# Patient Record
Sex: Male | Born: 1950
Health system: Southern US, Community
[De-identification: ages and names within clinical notes are randomized; demographics above are authoritative.]

## PROBLEM LIST (undated history)

## (undated) DIAGNOSIS — T7840XA Allergy, unspecified, initial encounter: Secondary | ICD-10-CM

## (undated) DIAGNOSIS — N200 Calculus of kidney: Secondary | ICD-10-CM

## (undated) DIAGNOSIS — I639 Cerebral infarction, unspecified: Secondary | ICD-10-CM

## (undated) DIAGNOSIS — Q2730 Arteriovenous malformation, site unspecified: Secondary | ICD-10-CM

## (undated) HISTORY — DX: Cerebral infarction, unspecified: I63.9

## (undated) HISTORY — DX: Allergy, unspecified, initial encounter: T78.40XA

## (undated) HISTORY — PX: SPINE SURGERY: SHX786

## (undated) HISTORY — PX: LAMINECTOMY: SHX219

---

## 1968-07-09 DIAGNOSIS — M545 Low back pain, unspecified: Secondary | ICD-10-CM | POA: Insufficient documentation

## 1968-07-09 HISTORY — PX: INGUINAL HERNIA REPAIR: SUR1180

## 1996-07-09 HISTORY — PX: BACK SURGERY: SHX140

## 2013-05-09 DEATH — deceased

## 2015-05-02 DIAGNOSIS — H2513 Age-related nuclear cataract, bilateral: Secondary | ICD-10-CM | POA: Diagnosis not present

## 2015-05-05 ENCOUNTER — Encounter (HOSPITAL_COMMUNITY): Payer: Self-pay | Admitting: *Deleted

## 2015-05-05 ENCOUNTER — Emergency Department (HOSPITAL_COMMUNITY): Payer: Medicare Other

## 2015-05-05 ENCOUNTER — Emergency Department (HOSPITAL_COMMUNITY)
Admission: EM | Admit: 2015-05-05 | Discharge: 2015-05-05 | Disposition: A | Discharge status: Home / Self Care | Payer: Medicare Other | Attending: Emergency Medicine | Admitting: Emergency Medicine

## 2015-05-05 DIAGNOSIS — Q2730 Arteriovenous malformation, site unspecified: Secondary | ICD-10-CM | POA: Diagnosis not present

## 2015-05-05 DIAGNOSIS — Z87442 Personal history of urinary calculi: Secondary | ICD-10-CM | POA: Insufficient documentation

## 2015-05-05 DIAGNOSIS — R2689 Other abnormalities of gait and mobility: Secondary | ICD-10-CM | POA: Diagnosis not present

## 2015-05-05 DIAGNOSIS — Z720 Tobacco use: Secondary | ICD-10-CM | POA: Diagnosis not present

## 2015-05-05 DIAGNOSIS — R42 Dizziness and giddiness: Secondary | ICD-10-CM | POA: Insufficient documentation

## 2015-05-05 DIAGNOSIS — R278 Other lack of coordination: Secondary | ICD-10-CM | POA: Diagnosis not present

## 2015-05-05 DIAGNOSIS — R030 Elevated blood-pressure reading, without diagnosis of hypertension: Secondary | ICD-10-CM | POA: Diagnosis not present

## 2015-05-05 DIAGNOSIS — Q283 Other malformations of cerebral vessels: Secondary | ICD-10-CM | POA: Insufficient documentation

## 2015-05-05 DIAGNOSIS — R413 Other amnesia: Secondary | ICD-10-CM | POA: Diagnosis not present

## 2015-05-05 DIAGNOSIS — R4182 Altered mental status, unspecified: Secondary | ICD-10-CM | POA: Diagnosis not present

## 2015-05-05 DIAGNOSIS — Q282 Arteriovenous malformation of cerebral vessels: Secondary | ICD-10-CM | POA: Diagnosis not present

## 2015-05-05 HISTORY — DX: Calculus of kidney: N20.0

## 2015-05-05 HISTORY — DX: Arteriovenous malformation, site unspecified: Q27.30

## 2015-05-05 LAB — CBC
HEMATOCRIT: 47.1 % (ref 39.0–52.0)
Hemoglobin: 16.2 g/dL (ref 13.0–17.0)
MCH: 29.3 pg (ref 26.0–34.0)
MCHC: 34.4 g/dL (ref 30.0–36.0)
MCV: 85.3 fL (ref 78.0–100.0)
Platelets: 192 10*3/uL (ref 150–400)
RBC: 5.52 MIL/uL (ref 4.22–5.81)
RDW: 13.2 % (ref 11.5–15.5)
WBC: 6.7 10*3/uL (ref 4.0–10.5)

## 2015-05-05 LAB — HEPATIC FUNCTION PANEL
ALK PHOS: 48 U/L (ref 38–126)
ALT: 23 U/L (ref 17–63)
AST: 38 U/L (ref 15–41)
Albumin: 3.9 g/dL (ref 3.5–5.0)
BILIRUBIN INDIRECT: 0.7 mg/dL (ref 0.3–0.9)
Bilirubin, Direct: 0.4 mg/dL (ref 0.1–0.5)
TOTAL PROTEIN: 5.9 g/dL — AB (ref 6.5–8.1)
Total Bilirubin: 1.1 mg/dL (ref 0.3–1.2)

## 2015-05-05 LAB — TROPONIN I: Troponin I: <0.03 ng/mL (ref <0.031)

## 2015-05-05 LAB — TSH: TSH: 1.734 u[IU]/mL (ref 0.350–4.500)

## 2015-05-05 LAB — BASIC METABOLIC PANEL
Anion gap: 7 (ref 5–15)
BUN: 12 mg/dL (ref 6–20)
CHLORIDE: 107 mmol/L (ref 101–111)
CO2: 27 mmol/L (ref 22–32)
Calcium: 9.3 mg/dL (ref 8.9–10.3)
Creatinine, Ser: 1.1 mg/dL (ref 0.61–1.24)
GFR calc Af Amer: >60 mL/min (ref >60)
GFR calc non Af Amer: >60 mL/min (ref >60)
GLUCOSE: 98 mg/dL (ref 65–99)
POTASSIUM: 4.5 mmol/L (ref 3.5–5.1)
Sodium: 141 mmol/L (ref 135–145)

## 2015-05-05 NOTE — ED Notes (Signed)
Patient transported to MRI 

## 2015-05-05 NOTE — ED Notes (Signed)
MD at bedside. 

## 2015-05-05 NOTE — ED Notes (Signed)
Patient reports 2 week onset of dizziness, not feeling right, lack of balance and coordination, and word searching. Patient reports his of AVM bleed and states these are the same symptoms.

## 2015-05-05 NOTE — Discharge Instructions (Signed)
Cavernous Malformation A cavernous malformation is an abnormal formation of blood vessels that are more likely than regular blood vessels to bleed (hemorrhage) and cause other problems. A cavernous malformation most often develops in or beneath the skin, but it can also develop in internal organs. It can be very dangerous if it develops in the brain or the spinal cord. CAUSES This condition may be caused by a gene that is passed down from parent to child (inherited). In some cases, the cause is not known. RISK FACTORS This condition is more likely to develop in people of Hispanic descent. SYMPTOMS Symptoms of this condition include:  Seizures.  Headaches.  Nausea or vomiting.  Changes in hearing or vision.  Paralysis. Symptoms usually develop only if the brain or spinal cord are affected. DIAGNOSIS This condition is diagnosed with an imaging test, such as MRI or a CT scan. TREATMENT Treatment for this condition depends on whether symptoms are present. If there are no symptoms, treatment may not be needed. If there are symptoms, treatment options may include:  Supportive care. This means that treatment focuses on relieving your symptoms.  Surgery. This condition should be monitored on a regular basis by a health care provider. HOME CARE INSTRUCTIONS  Take over-the-counter and prescription medicines only as told by your health care provider.  Keep all follow-up visits as told by your health care provider. This is important. SEEK MEDICAL CARE IF:  Your symptoms do not improve with treatment.  You develop new symptoms, including new changes in hearing or vision.  You develop a headache that does not go away.  Your symptoms return. SEEK IMMEDIATE MEDICAL CARE IF:  You are having stroke-like symptoms, such as:  Weakness or numbness of the face, arm, or leg.  Sudden trouble seeing with one eye or both eyes.  Trouble talking (aphasia) or slurred speech.  Sudden confusion  or trouble understanding.  You have a sudden, severe headache.  You have seizure or your seizures are not controlled with your seizure medicine.  You have dizziness or you lose your balance. These symptoms may represent a serious problem that is an emergency. Do not wait to see if the symptoms will go away. Get medical help right away. Call your local emergency services (911 in the U.[place] Do not drive yourself to the hospital.   This information is not intended to replace advice given to you by your health care provider. Make sure you discuss any questions you have with your health care provider.   Document Released: 03/17/2002 Document Revised: 03/16/2015 Document Reviewed: 08/19/2014 Elsevier Interactive Patient Education 2016 [employer].  Emergency Department Resource Guide 1) Find a Doctor and Pay Out of Pocket Although you won't have to find out who is covered by your insurance plan, it is a good idea to ask around and get recommendations. You will then need to call the office and see if the doctor you have chosen will accept you as a new patient and what types of options they offer for patients who are self-pay. Some doctors offer discounts or will set up payment plans for their patients who do not have insurance, but you will need to ask so you aren't surprised when you get to your appointment.  2) Contact Your Local Health Department Not all health departments have doctors that can see patients for sick visits, but many do, so it is worth a call to see if yours does. If you don't know where your local health department is, you  can check in your phone book. The CDC also has a tool to help you locate your state's health department, and many state websites also have listings of all of their local health departments.  3) Find a [facility] If your illness is not likely to be very severe or complicated, you may want to try a walk in clinic. These are popping up all over the country in  pharmacies, drugstores, and shopping centers. They're usually staffed by nurse practitioners or physician assistants that have been trained to treat common illnesses and complaints. They're usually fairly quick and inexpensive. However, if you have serious medical issues or chronic medical problems, these are probably not your best option.  No Primary Care Doctor: - Call Health Connect at  [phone] - they can help you locate a primary care doctor that  accepts your insurance, provides certain services, etc. - Physician Referral Service- [phone]  Chronic Pain Problems: Organization         Address  Phone   Notes  [facility]  [phone] Patients need to be referred by their primary care doctor.   Medication Assistance: Organization         Address  Phone   Notes  [facility] Medication [facility] [address] [place], [place] [phone] --Must be a resident of [facility] -- Must have NO insurance coverage whatsoever (no Medicaid/ Medicare, etc.) -- The pt. MUST have a primary care doctor that directs their care regularly and follows them in the community   [facility]ist  [phone]   [employer]  [phone]    Agencies that provide inexpensive medical care: Organization         Address  Phone   Notes  [place]  [phone]   [facility]    [phone]   [facility] [place], [place] [address] [phone]   [place] [address], [place] [phone]   Planned Parenthood    [phone]   [facility]    [phone]   [place] and [place] [address], [place] Phone:  [phone], Fax:  [phone] Hours of Operation:  9 am - 6 pm, M-F.  Also accepts Medicaid/Medicare and self-pay.  [facility] for [place], [address],  [place] Phone: [phone], Fax: [phone]. Hours of Operation:  8:30 am - 5:30 pm, M-F.  Also accepts Medicaid and self-pay.  [facility] [place] [address] [place] Phone: [phone]   [place], [place], [place] [phone], Ext. [phone] Mondays & Thursdays: 7-9 AM.  First 15 patients are seen on a first come, first serve basis.    White Providers:  Organization         Address  Phone   Notes  [facility] [address], [place] [phone] Also accepts self-pay patients.  [facility] [address], [place]  [phone]   [place], [address], [place] [phone]   [facility] [address] [place] [phone]   Lucianne Lei [address], [place]   [phone] Only accepts [place] Access [place] patients after they have their name applied  to their card.   Self-Pay (no insurance) in [facility]:  Organization         Address  Phone   Notes  Sickle Cell Patients, [facility] [place] [phone]   [facility] [place] [phone]   [facility] [place]  [place], [place], [place] [phone]   [facility]/Dr. Osei-Bonsu  [address] [place] or [address], [place] [phone] Phone number for both [place] and [place] locations is the same.  [facility] [address], [place] [phone]   [facility] [address] [place] or [address] [phone]   [facility] [address] [place] [phone], phone; [phone], fax Sees patients 1st and 3rd Saturday of every month.  Must not  qualify for public or private insurance (i.e. Medicaid, Medicare, [place] Health Choice, Veterans' Benefits)  Household income should be no more than 200% of the poverty level The clinic cannot treat you if you are pregnant or think you are pregnant  Sexually transmitted diseases are not treated at the clinic.    Dental Care: Organization         Address  Phone  Notes  [facility] Department of [facility] [place] [phone] Accepts children up to age 64 who are enrolled in [place] or [place]; pregnant women with a Medicaid card; and children who have applied for Medicaid or [place] Health Choice, but were declined, whose parents can pay a reduced fee at time of service.  [facility] Department of [facility]  [address], [place] [phone] Accepts children up to age 26 who are enrolled in [place] or [place]; pregnant women with a Medicaid card; and children who have applied for Medicaid or [place] Health Choice, but were declined, whose parents can pay a reduced fee at time of service.  [facility] PRO[facility]  [place] [phone] Patients are seen by appointment only. Walk-ins are not accepted. [place] will see patients 60 years of age and older. Monday - Tuesday (8am-5pm) Most Wednesdays (8:30-5pm) $30 per visit, cash only  [facility] Adult Dental Access PROGRAM  [address], [facility] [phone] Patients are seen by appointment only. Walk-ins are not accepted. [place] will see patients 27 years of age and older. One Wednesday Evening (Monthly: Volunteer Based).  $30 per visit, cash only  [place]  [phone] for adults; Children under age 30, call Graduate Pediatric Dentistry at [phone]. Children aged 63-14, please call [phone] to request a pediatric application.  Dental services are provided  in all areas of dental care including fillings, crowns and bridges, complete and partial dentures, implants, gum treatment, root canals, and extractions. Preventive care is also provided. Treatment is provided to both adults and children. Patients are selected via a lottery and there is often a waiting list.   [facility] [address] [place]  [phone] [email or web address]   [facility] [address] [place], [place] [phone], Ext. [phone] Second and Fourth Thursday of each month, opens at 6:30 AM; Clinic ends at 9 AM.  Patients are seen on a first-come first-served  basis, and a limited number are seen during each clinic.   [facility]  [address] [place] Danker [place], [place] [phone]   Eligibility Requirements You must have lived in [place], [place], or [place] counties for at least the last three months.   You cannot be eligible for state or federal sponsored [employer], including [employer], [place], or [employer].   You generally cannot be eligible for healthcare insurance through your employer.    How to apply: Eligibility screenings are held every Tuesday and Wednesday afternoon from 1:00 pm until 4:00 pm. You do not need an appointment for the interview!  [facility] [address] [place], [place]   [place]  [place] Department  [place]  [phone]    Behavioral Health Resources in the Community: Intensive Outpatient Programs Organization         Address  Phone  Notes  [place]. [address], [place], [place] [phone]   [facility] Outpatient [address] [place], [place]   ADS: [facility] Drug S[facility] [address] [place], [place]   [place] [address],  [place], [place] or [phone]   Substance Abuse Resources Organization         Address  Phone  Notes  Alcohol and Drug Services  [phone]   [place]  [phone]   [facility] [place]   Chinita Pester  [phone]   Residential & Outpatient Substance Abuse Program  [phone]   Psychological Services Organization         Address  Phone  Notes  [facility] Alligator  [place]  [phone]   [place] [address] [place] or [phone]    Mobile Crisis Teams Organization         Address  Phone  Notes  [facility], Mobile Crisis Care Unit  [phone]   [facility]  [address] [place], [place]   [place] [address], [place] [phone]    Self-Help/Support Groups Organization         Address  Phone             Notes  Advance. of [place] - variety of support groups  Zeba Call for more information  Narcotics Anonymous (NA), [facility] [address], [employer] [place]  2 meetings at this location   Special educational needs teacher         Address  Phone  Notes  [facility] Residential Treatment [place],    [place]  [phone]   [facility]  [address], [place] [address], [place], [place]   [place], [place] [phone] Admissions: 8am-3pm M-F  Incentives Substance [place] [address]    [place], [place] [phone]   [facility] [address] Jadene Pierini [place], [place]   [facility] [address]  [place], [place]   [facility] - Intensive Outpatient [address], [place], [place]   [facility] ([place] [place].,  [place], [place] or  [phone]   [facility] Services (RTS) [address] [place], [place] Accepts Medicaid  [facility] [address],  [place] [phone] Substance Abuse/Addiction Treatment   [facility] Organization         Address  Phone  Notes  [facility]  [phone]   Domenic Schwab, PhD [address] [place], [place]   [phone] or [phone]   [facility] [place], [place] [phone]   [address], [place], [place] [phone] Insurance/Medicaid/sponsorship through [facility] and Families [address]                                    [place], [place] [phone] [place] [address], [place] [phone]    Dr. Adele Schilder  [phone]   [facility] Dept. 1) [address] [place] 2) [place] 3)  [place] [address], [place] [phone]  [phone]   [place] [phone] or [phone] (After Hours)

## 2015-05-05 NOTE — Consult Note (Signed)
Referring Physician: ED    Chief Complaint: ataxia, dizziness, word finding difficulty  HPI:                                                                                                                                         Shawn Mccall is an 64 y.o. male with a past medical history significant for brainstem AVM and kidney stones, comes in for evaluation of the aforementioned symptoms. Patient indicated that for the past 2 weeks he has been experiencing daily unsteadiness with a tendency to lean to the left side, lightheadedness, and most recently trouble finding words out. He stressed that he had similar symptoms approximately 20 years ago at the time of being diagnosed with an inoperable brainstem AVM that over the years allegedly resolved without surgical intervention, and therefore he decided to come to the ED to get MRI brain and check into the status of that AVM. However, the said that after that initial diagnosis he saw a neurologist couple of years later that did not confirm the AVM diagnosis but told him that he has a cavernous angioma in the brainstem. Denies associated HA, double vision, difficulty swallowing, slurred speech, language or vision impairment. Numbness in his feet. Complains of having memory loss. No new medications. Importantly, he thinks that he may be having some depression as the result of " thinking about having an AVM that could rupture at any time". 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 No evidence of recent bleeding. Old small left cerebellar infarct but no acute/subacute ischemia. MRA brain unremarkable.   Date last known well: 2 weeks ago Time last known well: unable to determine tPA Given: no, late presentation   Past Medical History  Diagnosis Date  . Kidney stones   . AVM (arteriovenous malformation)     History  reviewed. No pertinent past surgical history.  History reviewed. No pertinent family history. Social History:  reports that he has been smoking.  He does not have any smokeless tobacco history on file. He reports that he does not drink alcohol. His drug history is not on file.  Allergies: Allergies not on file  Medications:  I have reviewed the patient's current medications.  ROS:                                                                                                                                       History obtained from chart review and the patient  General ROS: negative for - chills, fatigue, fever, night sweats, weight gain or weight loss Psychological ROS: negative for - behavioral disorder, hallucinations, or suicidal ideation. Ophthalmic ROS: negative for - blurry vision, double vision, eye pain or loss of vision ENT ROS: negative for - epistaxis, nasal discharge, oral lesions, sore throat, tinnitus or vertigo Allergy and Immunology ROS: negative for - hives or itchy/watery eyes Hematological and Lymphatic ROS: negative for - bleeding problems, bruising or swollen lymph nodes Endocrine ROS: negative for - galactorrhea, hair pattern changes, polydipsia/polyuria or temperature intolerance Respiratory ROS: negative for - cough, hemoptysis, shortness of breath or wheezing Cardiovascular ROS: negative for - chest pain, dyspnea on exertion, edema or irregular heartbeat Gastrointestinal ROS: negative for - abdominal pain, diarrhea, hematemesis, nausea/vomiting or stool incontinence Genito-Urinary ROS: negative for - dysuria, hematuria, incontinence or urinary frequency/urgency Musculoskeletal ROS: negative for - joint swelling or muscular weakness Neurological ROS: as noted in HPI Dermatological ROS: negative for rash and skin lesion changes   Physical  exam:  Constitutional: well developed, pleasant male in no apparent distress. Blood pressure 122/68, pulse 83, temperature 98 F (36.7 C), temperature source Oral, resp. rate 18, SpO2 95 %. Eyes: no jaundice or exophthalmos.  Head: normocephalic. Neck: supple, no bruits, no JVD. Cardiac: no murmurs. Lungs: clear. Abdomen: soft, no tender, no mass. Extremities: no edema, clubbing, or cyanosis.  Skin: no rash  Neurologic Examination:                                                                                                      General: NAD Mental Status: Alert, oriented, thought content appropriate.  Speech fluent without evidence of aphasia.  Able to follow 3 step commands without difficulty. Cranial Nerves: II: Discs flat bilaterally; Visual fields grossly normal, pupils equal, round, reactive to light and accommodation III,IV, VI: ptosis not present, extra-ocular motions intact bilaterally V,VII: smile symmetric, facial light touch sensation normal bilaterally VIII: hearing normal bilaterally IX,X: uvula rises symmetrically XI: bilateral shoulder shrug XII: midline tongue extension without atrophy or fasciculations  Motor: Right : Upper extremity   5/5    Left:     Upper extremity   5/5  Lower extremity  5/5     Lower extremity   5/5 Tone and bulk:normal tone throughout; no atrophy noted Sensory: Pinprick and light touch intact throughout, bilaterally Deep Tendon Reflexes:  Right: Upper Extremity   Left: Upper extremity   biceps (C-5 to C-6) 2/4   biceps (C-5 to C-6) 2/4 tricep (C7) 2/4    triceps (C7) 2/4 Brachioradialis (C6) 2/4  Brachioradialis (C6) 2/4  Lower Extremity Lower Extremity  quadriceps (L-2 to L-4) 2/4   quadriceps (L-2 to L-4) 2/4 Achilles (S1) 2/4   Achilles (S1) 2/4  Plantars: Right: downgoing   Left: downgoing Cerebellar: normal finger-to-nose,  normal heel-to-shin test Gait:  No ataxia. Romberg's sign negative. CV: pulses palpable throughout       Results for orders placed or performed during the hospital encounter of 05/05/15 (from the past 48 hour(s))  Basic metabolic panel     Status: None   Collection Time: 05/05/15  6:11 PM  Result Value Ref Range   Sodium 141 135 - 145 mmol/L   Potassium 4.5 3.5 - 5.1 mmol/L   Chloride 107 101 - 111 mmol/L   CO2 27 22 - 32 mmol/L   Glucose, Bld 98 65 - 99 mg/dL   BUN 12 6 - 20 mg/dL   Creatinine, Ser 1.10 0.61 - 1.24 mg/dL   Calcium 9.3 8.9 - 10.3 mg/dL   GFR calc non Af Amer >60 >60 mL/min   GFR calc Af Amer >60 >60 mL/min    Comment: (NOTE) The eGFR has been calculated using the CKD EPI equation. This calculation has not been validated in all clinical situations. eGFR's persistently <60 mL/min signify possible Chronic Kidney Disease.    Anion gap 7 5 - 15  CBC     Status: None   Collection Time: 05/05/15  6:11 PM  Result Value Ref Range   WBC 6.7 4.0 - 10.5 K/uL   RBC 5.52 4.22 - 5.81 MIL/uL   Hemoglobin 16.2 13.0 - 17.0 g/dL   HCT 47.1 39.0 - 52.0 %   MCV 85.3 78.0 - 100.0 fL   MCH 29.3 26.0 - 34.0 pg   MCHC 34.4 30.0 - 36.0 g/dL   RDW 13.2 11.5 - 15.5 %   Platelets 192 150 - 400 K/uL  Troponin I     Status: None   Collection Time: 05/05/15  6:11 PM  Result Value Ref Range   Troponin I <0.03 <0.031 ng/mL    Comment:        NO INDICATION OF MYOCARDIAL INJURY.   Hepatic function panel     Status: Abnormal   Collection Time: 05/05/15  6:11 PM  Result Value Ref Range   Total Protein 5.9 (L) 6.5 - 8.1 g/dL   Albumin 3.9 3.5 - 5.0 g/dL   AST 38 15 - 41 U/L   ALT 23 17 - 63 U/L   Alkaline Phosphatase 48 38 - 126 U/L   Total Bilirubin 1.1 0.3 - 1.2 mg/dL   Bilirubin, Direct 0.4 0.1 - 0.5 mg/dL   Indirect Bilirubin 0.7 0.3 - 0.9 mg/dL   No results found.    Assessment: 64 y.o. male with complains of imbalance, dizziness, word finding difficulty for the past 2 weeks. Neuro exam is non focal. MRI brain revealed a solitary cavernous malformation left inferior  colliculi without evidence of recent bleeding. Serologies are unremarkable.   Patient is concerned that he may be having some depression. I had an ample conversation with patient and his niece regarding the MRI/MRA  findings and explained to him that these tests do not reveal evidence of an AVM, and that most likely this left midbrain cavernous malformation is not reason for his symptoms. Advised follow up with neurology and his PCP.   Dorian Pod, MD [facility] [phone]  05/05/2015, 7:26 PM

## 2015-05-05 NOTE — ED Provider Notes (Signed)
CSN: [id number]     Arrival date & time 05/05/15  1639 History   First MD Initiated Contact with Patient 05/05/15 1716     Chief Complaint  Patient presents with  . Dizziness     (Consider location/radiation/quality/duration/timing/severity/associated sxs/prior Treatment) HPI 2 weeks ago the patient noticed a distinct change while he was at the beach. He reports that he had been exerting himself swimming against a rip tide and subsequent to that started to notice problems with dizziness and lightheadedness. He came particularly pronounced on the subsequent day while he was trying to play golf. He does not specifically endorse a significant headache. The patient has a history of a brainstem AVM that had a bleed associated with an approximately 20 years ago. He reports at that time it was nonoperative and it resolved with conservative management and observation. Over these past 2 weeks now he is perceiving some cognitive differences. He feels that he is having problems with some word finding. His companion notes that sometimes his gait seems to list more to the left. Patient has noticed imbalance more pronounced with trying to navigate stairs. He does not have focal extremity dysfunction or weakness. There is not been vomiting or visual loss. Patient has not been unwell in terms of fever or general constitutional symptoms. He denies any medical history and does not get regular medical care due to having been healthy in the past. The patient sought care today at the [facility] urgent care and was referred to the emergency department for diagnostic evaluation and neurology referral. Past Medical History  Diagnosis Date  . Kidney stones   . AVM (arteriovenous malformation)    History reviewed. No pertinent past surgical history. History reviewed. No pertinent family history. Social History  Substance Use Topics  . Smoking status: Current Every Day Smoker  . Smokeless tobacco: None  . Alcohol Use:  No    Review of Systems 10 Systems reviewed and are negative for acute change except as noted in the HPI.    Allergies  Review of patient's allergies indicates no known allergies.  Home Medications   Prior to Admission medications   Not on File   BP 148/93 mmHg  Pulse 91  Temp(Src) 98 F (36.7 C) (Oral)  Resp 13  SpO2 94% Physical Exam  Constitutional: He is oriented to person, place, and time. He appears well-developed and well-nourished.  HENT:  Head: Normocephalic and atraumatic.  Right Ear: External ear normal.  Left Ear: External ear normal.  Nose: Nose normal.  Mouth/Throat: Oropharynx is clear and moist.  Dentition is in good condition.  Eyes: EOM are normal. Pupils are equal, round, and reactive to light.  Neck: Neck supple.  Cardiovascular: Normal rate, regular rhythm, normal heart sounds and intact distal pulses.   Pulmonary/Chest: Effort normal and breath sounds normal.  Abdominal: Soft. Bowel sounds are normal. He exhibits no distension. There is no tenderness.  Musculoskeletal: Normal range of motion. He exhibits no edema.  Neurological: He is alert and oriented to person, place, and time. He has normal strength. No cranial nerve deficit. He exhibits normal muscle tone. Coordination normal. GCS eye subscore is 4. GCS verbal subscore is 5. GCS motor subscore is 6.  Normal finger-nose examination. During the exam, the patient's cognitive function is excellent. He has good historical recall and speech is clear with appropriate content.  Skin: Skin is warm, dry and intact.  Psychiatric: He has a normal mood and affect.    ED Course  Procedures (  including critical care time) Labs Review Labs Reviewed  HEPATIC FUNCTION PANEL - Abnormal; Notable for the following:    Total Protein 5.9 (*)    All other components within normal limits  BASIC METABOLIC PANEL  CBC  TROPONIN I  TSH  T3, FREE    Imaging Review Mr Brain Wo Contrast (neuro  Protocol)  05/05/2015  CLINICAL DATA:  64 year old male with 2 weeks of dizziness, loss of coordination. Word finding difficulty. Patient reports cerebral AVM. Initial encounter. EXAM: MRI HEAD WITHOUT CONTRAST MRA HEAD WITHOUT CONTRAST TECHNIQUE: Multiplanar, multiecho pulse sequences of the brain and surrounding structures were obtained without intravenous contrast. Angiographic images of the head were obtained using MRA technique without contrast. COMPARISON:  None. FINDINGS: MRI HEAD FINDINGS Major intracranial vascular flow voids are within normal limits ; there is mild intracranial artery tortuosity. Chronic blood products and heterogeneous increased and decreased T2 signal in the posterior midbrain on the left at the inferior colliculus (series 8, image 9 and series 10, image 9). This has a typical appearance of cavernous vascular malformation (series 12, image 14). No associated edema or midbrain expansion. No similar lesion elsewhere in the brain. No other chronic cerebral blood products identified. There is a small chronic infarct in the left superior cerebellum (series 8, image 7). No restricted diffusion or evidence of acute infarction. Aside from the above, gray and white matter signal is within normal limits. No midline shift, mass effect, ventriculomegaly, extra-axial collection or acute intracranial hemorrhage. Cervicomedullary junction and pituitary are within normal limits. Negative visualized cervical spine. Normal bone marrow signal. Visible internal auditory structures appear normal. Mastoids are clear. Mild paranasal sinus mucosal thickening. Negative orbit and scalp soft tissues. MRA HEAD FINDINGS Antegrade flow in the posterior circulation with mildly dominant distal left vertebral artery. Tortuous vertebrobasilar junction. Normal PICA origins. Tortuous basilar artery without stenosis. SCA and PCA origins are normal. Small posterior communicating arteries. Bilateral PCA branches are within  normal limits. As expected, no flow signal in the dorsal left midbrain lesion. Antegrade flow in both ICA siphons. Mild siphon tortuosity, no siphon stenosis. Ophthalmic and posterior communicating artery origins are normal. Patent carotid termini. Normal MCA and ACA origins. Anterior communicating artery and visualized ACA branches are within normal limits. MCA M1 segments and visualized bilateral MCA branches are within normal limits. IMPRESSION: 1.  No acute intracranial abnormality. 2. 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 No evidence of recent bleeding. 3. Small chronic left superior cerebellar infarct. 4. Negative intracranial MRA aside from vessel tortuosity. Electronically Signed   By: Genevie Ann M.D.   On: 05/05/2015 20:35   Mr Virgel Paling (cerebral Arteries)  05/05/2015  CLINICAL DATA:  64 year old male with 2 weeks of dizziness, loss of coordination. Word finding difficulty. Patient reports cerebral AVM. Initial encounter. EXAM: MRI HEAD WITHOUT CONTRAST MRA HEAD WITHOUT CONTRAST TECHNIQUE: Multiplanar, multiecho pulse sequences of the brain and surrounding structures were obtained without intravenous contrast. Angiographic images of the head were obtained using MRA technique without contrast. COMPARISON:  None. FINDINGS: MRI HEAD FINDINGS Major intracranial vascular flow voids are within normal limits ; there is mild intracranial artery tortuosity. Chronic blood products and heterogeneous increased and decreased T2 signal in the posterior midbrain on the left at the inferior colliculus (series 8, image 9 and series 10, image 9). This has a typical appearance of cavernous vascular malformation (series 12, image 14). No associated edema or midbrain expansion. No similar lesion elsewhere  in the brain. No other chronic cerebral blood products identified. There is a small chronic infarct in the left superior cerebellum (series 8,  image 7). No restricted diffusion or evidence of acute infarction. Aside from the above, gray and white matter signal is within normal limits. No midline shift, mass effect, ventriculomegaly, extra-axial collection or acute intracranial hemorrhage. Cervicomedullary junction and pituitary are within normal limits. Negative visualized cervical spine. Normal bone marrow signal. Visible internal auditory structures appear normal. Mastoids are clear. Mild paranasal sinus mucosal thickening. Negative orbit and scalp soft tissues. MRA HEAD FINDINGS Antegrade flow in the posterior circulation with mildly dominant distal left vertebral artery. Tortuous vertebrobasilar junction. Normal PICA origins. Tortuous basilar artery without stenosis. SCA and PCA origins are normal. Small posterior communicating arteries. Bilateral PCA branches are within normal limits. As expected, no flow signal in the dorsal left midbrain lesion. Antegrade flow in both ICA siphons. Mild siphon tortuosity, no siphon stenosis. Ophthalmic and posterior communicating artery origins are normal. Patent carotid termini. Normal MCA and ACA origins. Anterior communicating artery and visualized ACA branches are within normal limits. MCA M1 segments and visualized bilateral MCA branches are within normal limits. IMPRESSION: 1.  No acute intracranial abnormality. 2. 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 No evidence of recent bleeding. 3. Small chronic left superior cerebellar infarct. 4. Negative intracranial MRA aside from vessel tortuosity. Electronically Signed   By: Genevie Ann M.D.   On: 05/05/2015 20:35   I have personally reviewed and evaluated these images and lab results as part of my medical decision-making.   EKG Interpretation   Date/Time:  Thursday May 05 2015 17:51:24 EDT Ventricular Rate:  83 PR Interval:  154 QRS Duration: 97 QT Interval:  370 QTC  Calculation: 435 R Axis:   -29 Text Interpretation:  Age not entered, assumed to be  64 years old for  purpose of ECG interpretation Sinus rhythm Borderline left axis deviation  Low voltage, extremity and precordial leads agree. no ischemic appearance  Confirmed by Johnney Killian, MD, Jeannie Done [phone]) on 05/05/2015 5:57:28 PM     Consult: This case was reviewed with Dr. Aram Beecham who is counseled on palpation. At this time after MRI results and evaluation, the patient is safe for discharge with outpatient follow-up plan MDM   Final diagnoses:  Dizziness  Memory impairment of gradual onset  Cavernous malformation   Patient is well in appearance with normal neurologic examination. MRI does not show acute findings. At this point the plan will be for follow-up care for the patient.    Charlesetta Shanks, MD 05/05/15 [phone]

## 2015-05-06 LAB — T3, FREE: T3 FREE: 3.5 pg/mL (ref 2.0–4.4)

## 2015-05-10 ENCOUNTER — Ambulatory Visit (INDEPENDENT_AMBULATORY_CARE_PROVIDER_SITE_OTHER): Payer: Medicare Other | Admitting: Neurology

## 2015-05-10 ENCOUNTER — Encounter: Payer: Self-pay | Admitting: Neurology

## 2015-05-10 VITALS — BP 148/81 | HR 90 | Ht 72.5 in | Wt 223.4 lb

## 2015-05-10 DIAGNOSIS — R2689 Other abnormalities of gait and mobility: Secondary | ICD-10-CM

## 2015-05-10 DIAGNOSIS — Q283 Other malformations of cerebral vessels: Secondary | ICD-10-CM | POA: Diagnosis not present

## 2015-05-10 DIAGNOSIS — I639 Cerebral infarction, unspecified: Secondary | ICD-10-CM | POA: Insufficient documentation

## 2015-05-10 DIAGNOSIS — R42 Dizziness and giddiness: Secondary | ICD-10-CM

## 2015-05-10 MED ORDER — ASPIRIN EC 81 MG PO TBEC
81.0000 mg | DELAYED_RELEASE_TABLET | Freq: Every day | ORAL | Status: DC
Start: 2015-05-10 — End: 2024-05-24

## 2015-05-10 MED ORDER — MECLIZINE HCL 25 MG PO TABS: 25.0000 mg | ORAL_TABLET | Freq: Three times a day (TID) | ORAL | Status: DC | PRN

## 2015-05-10 NOTE — Patient Instructions (Addendum)
Remember to drink plenty of fluid, eat healthy meals and do not skip any meals. Try to eat protein with a every meal and eat a healthy snack such as fruit or nuts in between meals. Try to keep a regular sleep-wake schedule and try to exercise daily, particularly in the form of walking, 20-30 minutes a day, if you can.   As far as your medications are concerned, I would like to suggest; meclizine 25mg  three times a day as needed  I would like to see you back as needed, sooner if we need to. Please call us with any interim questions, concerns, problems, updates or refill requests.   Our phone number is [phone]. We also have an after hours call service for urgent matters and there is a physician on-call for urgent questions. For any emergencies you know to call 911 or go to the nearest emergency room

## 2015-05-10 NOTE — Progress Notes (Addendum)
[facility]    Provider:  Dr Jaynee Eagles Referring Provider: [name], MD Primary Care Physician:  No PCP Per Patient  CC:  Cavernous malform  HPI:  Shawn Mccall is a 64 y.o. male here as a referral from Dr. Atilano Median for Dizziness and lightheadedness. Started years ago in the 1990s. But had another episode of vertigo at the beach during [place]. He had similar symptoms in the mid 90s he was cleaning out a cabin and got classic vertigo. 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 In 1998 he saw a neurologist and had an MRi of the brain with contrast, they found a cavernoma in the brainstem. He had a sensation of something dripping in his head. He has experienced a couple incidents of bleeding. [place] and was straining and since then he has not been feeling right, new symptoms superimpose don his chronic imbalance with vertigo. He went to the urgent care center. Then he went to the ED. He is having a sensation of tunnel vision but his visual fields are fine per opthalmology. Difficulty walking a straight line with the vertigo. He feels lightheaded. He has dizziness worse in the morning that is more recent. He is feeling dizzy right now. He hasn't had the vertigo more recently. More a feeling of dizziness and lightheadedness. In his head he feels a sensation in his head. He feels dizzy when standing. He has hit his head in the past. No double vision. He has had ocular migraines over the years. No current headaches. He feels like he has a hangover, he feels run down, no headache. No weakness. He has numbness in his feet. No hearing loss, no fullness in the ears, no sinus issues. He has not tried anything for his dizziness. He is a current smoker. He does not have a primary care physician.   Reviewed notes, labs and imaging from outside physicians, which showed:  Cbc, Bmp,tsh nml  Personally  reviewed imaging of the brain and agree with following:   IMPRESSION: 1. No acute intracranial abnormality. 2. 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 No evidence of recent bleeding. 3. Small chronic left superior cerebellar infarct. 4. Negative intracranial MRA aside from vessel tortuosity.   Review of Systems: Patient complains of symptoms per HPI as well as the following symptoms: fatigue, memory loss, confusion, numbnes sin the feet, dizziness, decreased energy, disinterest in activities Pertinent negatives per HPI. All others negative.   Social History   Social History  . Marital Status: Single    Spouse Name: N/A  . Number of Children: 0  . Years of Education: N/A   Occupational History  . Retired    Social History Main Topics  . Smoking status: Current Every Day Smoker  . Smokeless tobacco: Not on file  . Alcohol Use: No  . Drug Use: Not on file  . Sexual Activity: Not on file   Other Topics Concern  . Not on file   Social History Narrative   Lives at home by himself.   Caffeine use: 4 cups per day    Family History  Problem Relation Age of Onset  . Neuropathy Neg Hx   . Stroke Neg Hx     Past Medical History  Diagnosis Date  . Kidney stones   . AVM (arteriovenous malformation)     Past  Surgical History  Procedure Laterality Date  . Back surgery  1998    L5-S1 partial disectomy   . Inguinal hernia repair  1970    Current Outpatient Prescriptions  Medication Sig Dispense Refill  . Chlorphen-Pseudoephed-APAP (CORICIDIN D PO) Take 1 tablet by mouth as needed.    . Multiple Vitamins-Minerals (MULTIVITAMIN ADULT PO) Take 1 tablet by mouth daily.    [name] Kitchen aspirin EC 81 MG tablet Take 1 tablet (81 mg total) by mouth daily. 30 tablet 0  . meclizine (ANTIVERT) 25 MG tablet Take 1 tablet (25 mg total) by mouth 3 (three) times daily as needed for dizziness. 90 tablet 11   No  current facility-administered medications for this visit.    Allergies as of 05/10/2015  . (No Known Allergies)    Vitals: BP 148/81 mmHg  Pulse 90  Ht 6' 0.5" (1.842 m)  Wt 223 lb 6.4 oz (101.334 kg)  BMI 29.87 kg/m2 Last Weight:  Wt Readings from Last 1 Encounters:  05/10/15 223 lb 6.4 oz (101.334 kg)   Last Height:   Ht Readings from Last 1 Encounters:  05/10/15 6' 0.5" (1.842 m)    Physical exam: Exam: Gen: NAD, conversant, well nourised, well groomed                     CV: RRR, no MRG. No Carotid Bruits. No peripheral edema, warm, nontender Eyes: Conjunctivae clear without exudates or hemorrhage  Neuro: Detailed Neurologic Exam  Speech:    Speech is normal; fluent and spontaneous with normal comprehension.  Cognition:    The patient is oriented to person, place, and time;     recent and remote memory intact;     language fluent;     normal attention, concentration,     fund of knowledge Cranial Nerves:    The pupils are equal, round, and reactive to light. The fundi are flat. Visual fields are full to finger confrontation. Extraocular movements are intact. Trigeminal sensation is intact and the muscles of mastication are normal. The face is symmetric. The palate elevates in the midline. Hearing intact. Voice is normal. Shoulder shrug is normal. The tongue has normal motion without fasciculations.   Coordination:    Normal finger to nose and heel to shin.   Gait:    Heel-toe and tandem gait are normal.   Motor Observation:    No asymmetry, no atrophy, and no involuntary movements noted. Tone:    Normal muscle tone.    Posture:    Posture is normal. normal erect    Strength:    Strength is V/V in the upper and lower limbs.      Sensation: intact to LT. Dec pin prick tod temp to ankles, absent vibration at the toes, intact proprioception     Reflex Exam:  DTR's: Absent AJs otherwise deep tendon reflexes in the upper and lower extremities are normal  bilaterally.   Toes:    The toes are downgoing bilaterally.   Clonus:    Clonus is absent.      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 No evidence of recent bleeding. Small chronic left superior cerebellar infarct. He c/o of dizziness. Does not have a primary care. Cavernoma is non operable. Discussed cavernomas and risk for bleeding. Needs daily asa and a workup for strokes but he declines any more testing today.    Baby aspirin daily  for stroke prevention. Recommended stroke workup, he declines. Need primary care follow up and management of vascular risk factors. Recommended a workup for neuropathy, he declined He just wants medication for his dizziness. Will start meclizine prn. F/u with a primary care.  For new or worsening symptoms especially seizures, AMS, worsening headache needs to proceed to ED.  Smoking cessation  Dr Ernesto Rutherford: Notes state no evidence of otologic disease, vertigo is likely cerebellar in origin. Dated 05/24/2015.  Sarina Ill, MD  [facility] [address] [place], [place] [address]  Phone [phone] Fax [phone]

## 2015-05-16 ENCOUNTER — Encounter: Payer: Self-pay | Admitting: Neurology

## 2015-05-17 ENCOUNTER — Encounter: Payer: Self-pay | Admitting: Neurology

## 2015-05-22 ENCOUNTER — Encounter: Payer: Self-pay | Admitting: Neurology

## 2015-05-23 ENCOUNTER — Encounter: Payer: Self-pay | Admitting: *Deleted

## 2015-05-23 ENCOUNTER — Encounter: Payer: Self-pay | Admitting: Neurology

## 2015-05-23 ENCOUNTER — Other Ambulatory Visit: Payer: Self-pay | Admitting: *Deleted

## 2015-05-23 ENCOUNTER — Telehealth: Payer: Self-pay | Admitting: *Deleted

## 2015-05-23 DIAGNOSIS — R42 Dizziness and giddiness: Secondary | ICD-10-CM

## 2015-05-23 DIAGNOSIS — H9202 Otalgia, left ear: Secondary | ICD-10-CM

## 2015-05-23 MED ORDER — PROMETHAZINE HCL 12.5 MG PO TABS: 12.5000 mg | ORAL_TABLET | Freq: Four times a day (QID) | ORAL | Status: DC | PRN

## 2015-05-23 NOTE — Telephone Encounter (Signed)
Sent rx promethazine to pt pharmacy per Dr Felecia Shelling request.

## 2015-05-24 DIAGNOSIS — H8143 Vertigo of central origin, bilateral: Secondary | ICD-10-CM | POA: Diagnosis not present

## 2015-05-24 DIAGNOSIS — H9312 Tinnitus, left ear: Secondary | ICD-10-CM | POA: Diagnosis not present

## 2015-05-24 DIAGNOSIS — R42 Dizziness and giddiness: Secondary | ICD-10-CM | POA: Diagnosis not present

## 2015-07-14 ENCOUNTER — Encounter: Payer: Self-pay | Admitting: Neurology

## 2015-07-15 ENCOUNTER — Other Ambulatory Visit: Payer: Self-pay | Admitting: Neurology

## 2015-07-15 DIAGNOSIS — F17200 Nicotine dependence, unspecified, uncomplicated: Secondary | ICD-10-CM

## 2015-07-15 MED ORDER — BUPROPION HCL ER (SR) 150 MG PO TB12: 150.0000 mg | ORAL_TABLET | Freq: Two times a day (BID) | ORAL | Status: DC

## 2015-07-22 ENCOUNTER — Encounter: Payer: Self-pay | Admitting: Neurology

## 2016-12-26 ENCOUNTER — Other Ambulatory Visit: Payer: Self-pay | Admitting: Neurology

## 2016-12-26 NOTE — Telephone Encounter (Signed)
Pt only seen once in Nov 2016 for initial evaluation of vertigo. Hx of slow flow vascular malformation and small chronic left superior cerebellar infarct. Was told that cavernoma is non-operable but can increase risk for bleeding and was advised to take daily aspirin. Heclined any additional workup for strokes. Does not have PCP. Was prescribed generic Wellbutrin in Jan 2017 for smoking cessation.

## 2016-12-26 NOTE — Telephone Encounter (Signed)
Pt calling [id number] (WELLBUTRIN SR) 150 MG 12 hr tablet he has been told that he must schedule an appointment for the renewal of this medication but would very much like a call back if this could be refilled without him having to come into the office for a visit.

## 2016-12-26 NOTE — Telephone Encounter (Signed)
He needs t go to his pcp, it has been over a year and a half and I don;t even really manage smoking cessation which he should have stopped within a year or Wellbutrin not working he should f/u with pcp thanks

## 2016-12-31 ENCOUNTER — Encounter: Payer: Self-pay | Admitting: Neurology

## 2016-12-31 DIAGNOSIS — F17200 Nicotine dependence, unspecified, uncomplicated: Secondary | ICD-10-CM

## 2016-12-31 MED ORDER — BUPROPION HCL ER (SR) 150 MG PO TB12: 150.0000 mg | ORAL_TABLET | Freq: Two times a day (BID) | ORAL | 0 refills | Status: DC

## 2017-01-29 ENCOUNTER — Encounter: Payer: Self-pay | Admitting: Neurology

## 2017-01-29 ENCOUNTER — Ambulatory Visit (INDEPENDENT_AMBULATORY_CARE_PROVIDER_SITE_OTHER): Payer: Medicare Other | Admitting: Neurology

## 2017-01-29 VITALS — BP 122/78 | Ht 72.5 in | Wt 209.2 lb

## 2017-01-29 DIAGNOSIS — E538 Deficiency of other specified B group vitamins: Secondary | ICD-10-CM

## 2017-01-29 DIAGNOSIS — G629 Polyneuropathy, unspecified: Secondary | ICD-10-CM | POA: Diagnosis not present

## 2017-01-29 DIAGNOSIS — R2689 Other abnormalities of gait and mobility: Secondary | ICD-10-CM | POA: Diagnosis not present

## 2017-01-29 DIAGNOSIS — R42 Dizziness and giddiness: Secondary | ICD-10-CM | POA: Diagnosis not present

## 2017-01-29 DIAGNOSIS — R7309 Other abnormal glucose: Secondary | ICD-10-CM

## 2017-01-29 DIAGNOSIS — F17200 Nicotine dependence, unspecified, uncomplicated: Secondary | ICD-10-CM | POA: Diagnosis not present

## 2017-01-29 DIAGNOSIS — R202 Paresthesia of skin: Secondary | ICD-10-CM | POA: Diagnosis not present

## 2017-01-29 DIAGNOSIS — IMO0001 Reserved for inherently not codable concepts without codable children: Secondary | ICD-10-CM

## 2017-01-29 DIAGNOSIS — W57XXXA Bitten or stung by nonvenomous insect and other nonvenomous arthropods, initial encounter: Secondary | ICD-10-CM | POA: Diagnosis not present

## 2017-01-29 MED ORDER — MECLIZINE HCL 25 MG PO TABS: 25.0000 mg | ORAL_TABLET | Freq: Three times a day (TID) | ORAL | 11 refills | Status: DC | PRN

## 2017-01-29 MED ORDER — BUPROPION HCL ER (SR) 150 MG PO TB12: 150.0000 mg | ORAL_TABLET | Freq: Two times a day (BID) | ORAL | 6 refills | Status: DC

## 2017-01-29 NOTE — Progress Notes (Signed)
[facility]   Provider:  Dr Jaynee Eagles Referring Provider: [name], MD Primary Care Physician:  No PCP Per Patient  CC:  Cavernous malform, neuropathy, depression, smoking  Interval history: Patient is still smoking, discussed smoking cessation. He continues to have sensory changes in the feet. Today he feel depressed, has been feeling more down lately despite working out more and losing weight and feeling better. He started taking Wellbutrin again in hopes of improving his mood and trying to stop smoking again. Discussed checking for common causes of neuropathy today.  HPI:  Shawn Mccall is a 66 y.o. male here as a referral from Dr. Atilano Median for Dizziness and lightheadedness. Started years ago in the 1990s. But had another episode of vertigo at the beach during [name]. He had similar symptoms in the mid 90s he was cleaning out a cabin and got classic vertigo. 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 In 1998 he saw a neurologist and had an MRi of the brain with contrast, they found a cavernoma in the brainstem. He had a sensation of something dripping in his head. He has experienced a couple incidents of bleeding. He was fighting [place] during [place] and was straining and since then he has not been feeling right, new symptoms superimpose don his chronic imbalance with vertigo. He went to the urgent care center. Then he went to the ED. He is having a sensation of tunnel vision but his visual fields are fine per opthalmology. Difficulty walking a straight line with the vertigo. He feels lightheaded. He has dizziness worse in the morning that is more recent. He is feeling dizzy right now. He hasn't had the vertigo more recently. More a feeling of dizziness and lightheadedness. In his head he feels a sensation in his head. He feels dizzy when standing. He has hit his head in the past. No double vision. He has had  ocular migraines over the years. No current headaches. He feels like he has a hangover, he feels run down, no headache. No weakness. He has numbness in his feet. No hearing loss, no fullness in the ears, no sinus issues. He has not tried anything for his dizziness. He is a current smoker. He does not have a primary care physician.   Reviewed notes, labs and imaging from outside physicians, which showed:  Cbc, Bmp,tsh nml  Personally reviewed imaging of the brain and agree with following:   IMPRESSION: 1. No acute intracranial abnormality. 2. 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 No evidence of recent bleeding. 3. Small chronic left superior cerebellar infarct. 4. Negative intracranial MRA aside from vessel tortuosity.   Review of Systems: Patient complains of symptoms per HPI as well as the following symptoms: fatigue, memory loss, confusion, numbnes sin the feet, dizziness, decreased energy, disinterest in activities Pertinent negatives per HPI. All others negative.   Social History   Social History  . Marital status: Single    Spouse name: N/A  . Number of children: 0  . Years of education: N/A   Occupational History  . Retired    Social History Main Topics  . Smoking status: Current Every Day Smoker    Packs/day: 0.50  . Smokeless tobacco: Never Used  . Alcohol use No  . Drug use: Unknown  . Sexual activity: Not on file   Other Topics Concern  . Not on  file   Social History Narrative   Lives at home by himself.   Caffeine use: 4 cups per day    Family History  Problem Relation Age of Onset  . Neuropathy Neg Hx   . Stroke Neg Hx     Past Medical History:  Diagnosis Date  . AVM (arteriovenous malformation)   . Kidney stones     Past Surgical History:  Procedure Laterality Date  . BACK SURGERY  1998   L5-S1 partial disectomy   . INGUINAL HERNIA REPAIR  1970     Current Outpatient Prescriptions  Medication Sig Dispense Refill  . aspirin EC 81 MG tablet Take 1 tablet (81 mg total) by mouth daily. 30 tablet 0  . fluticasone (FLONASE) 50 MCG/ACT nasal spray Place 1 spray into both nostrils daily.    . meclizine (ANTIVERT) 25 MG tablet Take 1 tablet (25 mg total) by mouth 3 (three) times daily as needed for dizziness. 90 tablet 11  . Multiple Vitamins-Minerals (MULTIVITAMIN ADULT PO) Take 1 tablet by mouth daily.    [name] Kitchen buPROPion (WELLBUTRIN SR) 150 MG 12 hr tablet Take 1 tablet (150 mg total) by mouth 2 (two) times daily. 60 tablet 5   No current facility-administered medications for this visit.     Allergies as of 01/29/2017  . (No Known Allergies)    Vitals: BP 122/78   Ht 6' 0.5" (1.842 m)   Wt 209 lb 3.2 oz (94.9 kg)   BMI 27.98 kg/m  Last Weight:  Wt Readings from Last 1 Encounters:  01/29/17 209 lb 3.2 oz (94.9 kg)   Last Height:   Ht Readings from Last 1 Encounters:  01/29/17 6' 0.5" (1.842 m)   Physical exam: Exam: Gen: NAD, conversant, well nourised, obese, well groomed                     CV: RRR, no MRG. No Carotid Bruits. No peripheral edema, warm, nontender Eyes: Conjunctivae clear without exudates or hemorrhage  Neuro: Detailed Neurologic Exam  Speech:    Speech is normal; fluent and spontaneous with normal comprehension.  Cognition:    The patient is oriented to person, place, and time;     recent and remote memory intact;     language fluent;     normal attention, concentration,     fund of knowledge Cranial Nerves:    The pupils are equal, round, and reactive to light. The fundi are normal and spontaneous venous pulsations are present. Visual fields are full to finger confrontation. Extraocular movements are intact. Trigeminal sensation is intact and the muscles of mastication are normal. The face is symmetric. The palate elevates in the midline. Hearing intact. Voice is normal. Shoulder shrug is normal. The  tongue has normal motion without fasciculations.   Coordination:    Normal finger to nose and heel to shin. Normal rapid alternating movements.   Gait:   Normal native gait, no imbalance  Motor Observation:    No asymmetry, no atrophy, and no involuntary movements noted. Tone:    Normal muscle tone.    Posture:    Posture is normal. normal erect    Strength:    Strength is V/V in the upper and lower limbs.      Sensation: dec pp,temp,vibration distally in the LE     Reflex Exam:  DTR's:    Absent AJs. Otherwise deep tendon reflexes in the upper and lower extremities are normal bilaterally.  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 No evidence of recent bleeding. Small chronic left superior cerebellar infarct. He c/o of neuropathy in the feet, smoking, depression. Does not have a primary care. Cavernoma is non operable. Discussed cavernomas and risk for bleeding. Needs daily asa and a workup for strokes but he declines any this testing again today.   Smoking: Discussed risk of stroke and other disease, continue Wellbutrin SR Depression: Wellbutrin SR may help, needs f/u with pcp to manage vascular risk factors Neuropathy: serum workup today, he agrees to limited labs, worried about finances Baby aspirin daily for stroke prevention. Recommended stroke workup, he declines.   Dr Ernesto Rutherford: Notes state no evidence of otologic disease, vertigo is likely cerebellar in origin. Dated 05/24/2015. Sarina Ill, MD  [facility] [address] [place], [place] [address]  Phone [phone] Fax [phone]  A total of 25 minutes was spent face-to-face with this patient. Over half this time was spent on counseling patient on the depression, smoking, neuropathy diagnosis and different diagnostic and therapeutic options available.

## 2017-01-30 ENCOUNTER — Encounter: Payer: Self-pay | Admitting: Neurology

## 2017-01-30 DIAGNOSIS — R202 Paresthesia of skin: Secondary | ICD-10-CM

## 2017-01-30 DIAGNOSIS — R42 Dizziness and giddiness: Secondary | ICD-10-CM

## 2017-01-30 DIAGNOSIS — F17200 Nicotine dependence, unspecified, uncomplicated: Secondary | ICD-10-CM

## 2017-01-30 MED ORDER — BUPROPION HCL ER (SR) 150 MG PO TB12: 150.0000 mg | ORAL_TABLET | Freq: Two times a day (BID) | ORAL | 5 refills | Status: DC

## 2017-01-31 ENCOUNTER — Telehealth: Payer: Self-pay | Admitting: *Deleted

## 2017-01-31 LAB — CBC
Hematocrit: 48.4 % (ref 37.5–51.0)
Hemoglobin: 16.3 g/dL (ref 13.0–17.7)
MCH: 29.4 pg (ref 26.6–33.0)
MCHC: 33.7 g/dL (ref 31.5–35.7)
MCV: 87 fL (ref 79–97)
PLATELETS: 211 10*3/uL (ref 150–379)
RBC: 5.55 x10E6/uL (ref 4.14–5.80)
RDW: 13.9 % (ref 12.3–15.4)
WBC: 7.5 10*3/uL (ref 3.4–10.8)

## 2017-01-31 LAB — COMPREHENSIVE METABOLIC PANEL
A/G RATIO: 2.3 — AB (ref 1.2–2.2)
ALBUMIN: 4.5 g/dL (ref 3.6–4.8)
ALT: 20 IU/L (ref 0–44)
AST: 20 IU/L (ref 0–40)
Alkaline Phosphatase: 63 IU/L (ref 39–117)
BUN / CREAT RATIO: 12 (ref 10–24)
BUN: 14 mg/dL (ref 8–27)
Bilirubin Total: 0.3 mg/dL (ref 0.0–1.2)
CALCIUM: 9.3 mg/dL (ref 8.6–10.2)
CHLORIDE: 108 mmol/L — AB (ref 96–106)
CO2: 22 mmol/L (ref 20–29)
Creatinine, Ser: 1.18 mg/dL (ref 0.76–1.27)
GFR calc non Af Amer: 63 mL/min/{1.73_m2} (ref >59)
GFR, EST AFRICAN AMERICAN: 73 mL/min/{1.73_m2} (ref >59)
GLOBULIN, TOTAL: 2 g/dL (ref 1.5–4.5)
Glucose: 86 mg/dL (ref 65–99)
POTASSIUM: 4.9 mmol/L (ref 3.5–5.2)
SODIUM: 142 mmol/L (ref 134–144)
TOTAL PROTEIN: 6.5 g/dL (ref 6.0–8.5)

## 2017-01-31 LAB — METHYLMALONIC ACID, SERUM: Methylmalonic Acid: 112 nmol/L (ref 0–378)

## 2017-01-31 LAB — HEMOGLOBIN A1C
ESTIMATED AVERAGE GLUCOSE: 105 mg/dL
Hgb A1c MFr Bld: 5.3 % (ref 4.8–5.6)

## 2017-01-31 LAB — B12 AND FOLATE PANEL: VITAMIN B 12: 460 pg/mL (ref 232–1245)

## 2017-01-31 LAB — B. BURGDORFI ANTIBODIES

## 2017-01-31 NOTE — Telephone Encounter (Signed)
-----   Message from Melvenia Beam, MD sent at 01/31/2017 10:16 AM EDT ----- Labs normal. Still pending lyme disease results, if that is abnormal we will call him but otherwise labs normal thanks

## 2017-01-31 NOTE — Telephone Encounter (Signed)
LMOM that per [facility], lyme dz. test is still pending, but all other lab work is normal.  When lyme result is back, if it is abnormal, will call pt.  He does not need to return this call unless he has questions/fim

## 2017-02-05 ENCOUNTER — Encounter: Payer: Self-pay | Admitting: Neurology

## 2017-11-15 ENCOUNTER — Encounter: Payer: Self-pay | Admitting: Neurology

## 2017-11-19 ENCOUNTER — Encounter: Payer: Self-pay | Admitting: Neurology

## 2017-11-19 ENCOUNTER — Other Ambulatory Visit: Payer: Self-pay | Admitting: Neurology

## 2017-11-19 MED ORDER — FLUOXETINE HCL 20 MG PO CAPS: 20.0000 mg | ORAL_CAPSULE | Freq: Every day | ORAL | 6 refills | Status: DC

## 2018-01-05 ENCOUNTER — Encounter: Payer: Self-pay | Admitting: Neurology

## 2018-01-06 ENCOUNTER — Other Ambulatory Visit: Payer: Self-pay | Admitting: Neurology

## 2018-01-06 MED ORDER — BUPROPION HCL ER (SR) 150 MG PO TB12: 150.0000 mg | ORAL_TABLET | Freq: Two times a day (BID) | ORAL | 4 refills | Status: DC

## 2018-03-09 DIAGNOSIS — M7989 Other specified soft tissue disorders: Secondary | ICD-10-CM | POA: Insufficient documentation

## 2018-03-24 ENCOUNTER — Telehealth: Payer: Self-pay | Admitting: Neurology

## 2018-03-24 NOTE — Telephone Encounter (Signed)
Dr. Jaynee Eagles unable to provide suggestions for PCP. Pt will be advised that he can check with his insurance company for assistance.

## 2018-03-24 NOTE — Telephone Encounter (Signed)
Spoke with the patient and advised him that we cannot advise a particular GP. He verbalized understanding. He wanted someone who was in our network of [facility] so I advised him that if he goes to [email or web address] he can select the link "find a provider" and will see many types of specialities and available doctors. Pt was very appreciative.

## 2018-03-24 NOTE — Telephone Encounter (Signed)
Pt is asking if Dr Jaynee Eagles can provide him with a suggested GP within [facility] in [place]. Please call

## 2018-04-07 ENCOUNTER — Encounter: Payer: Self-pay | Admitting: Family Medicine

## 2018-04-07 ENCOUNTER — Ambulatory Visit (INDEPENDENT_AMBULATORY_CARE_PROVIDER_SITE_OTHER): Payer: Medicare Other | Admitting: Family Medicine

## 2018-04-07 VITALS — BP 122/77 | HR 94 | Temp 97.0°F | Ht 72.5 in | Wt 225.0 lb

## 2018-04-07 DIAGNOSIS — R609 Edema, unspecified: Secondary | ICD-10-CM | POA: Diagnosis not present

## 2018-04-07 DIAGNOSIS — R21 Rash and other nonspecific skin eruption: Secondary | ICD-10-CM

## 2018-04-07 DIAGNOSIS — Z125 Encounter for screening for malignant neoplasm of prostate: Secondary | ICD-10-CM | POA: Diagnosis not present

## 2018-04-07 DIAGNOSIS — R600 Localized edema: Secondary | ICD-10-CM | POA: Diagnosis not present

## 2018-04-07 DIAGNOSIS — R05 Cough: Secondary | ICD-10-CM

## 2018-04-07 DIAGNOSIS — Z136 Encounter for screening for cardiovascular disorders: Secondary | ICD-10-CM | POA: Diagnosis not present

## 2018-04-07 DIAGNOSIS — R059 Cough, unspecified: Secondary | ICD-10-CM

## 2018-04-07 DIAGNOSIS — W57XXXA Bitten or stung by nonvenomous insect and other nonvenomous arthropods, initial encounter: Secondary | ICD-10-CM

## 2018-04-07 MED ORDER — BUPROPION HCL ER (SR) 150 MG PO TB12: 150.0000 mg | ORAL_TABLET | Freq: Two times a day (BID) | ORAL | 4 refills | Status: DC

## 2018-04-07 NOTE — Patient Instructions (Signed)
Edema Edema is when you have too much fluid in your body or under your skin. Edema may make your legs, feet, and ankles swell up. Swelling is also common in looser tissues, like around your eyes. This is a common condition. It gets more common as you get older. There are many possible causes of edema. Eating too much salt (sodium) and being on your feet or sitting for a long time can cause edema in your legs, feet, and ankles. Hot weather may make edema worse. Edema is usually painless. Your skin may look swollen or shiny. Follow these instructions at home:  Keep the swollen body part raised (elevated) above the level of your heart when you are sitting or lying down.  Do not sit still or stand for a long time.  Do not wear tight clothes. Do not wear garters on your upper legs.  Exercise your legs. This can help the swelling go down.  Wear elastic bandages or support stockings as told by your doctor.  Eat a low-salt (low-sodium) diet to reduce fluid as told by your doctor.  Depending on the cause of your swelling, you may need to limit how much fluid you drink (fluid restriction).  Take over-the-counter and prescription medicines only as told by your doctor. Contact a doctor if:  Treatment is not working.  You have heart, liver, or kidney disease and have symptoms of edema.  You have sudden and unexplained weight gain. Get help right away if:  You have shortness of breath or chest pain.  You cannot breathe when you lie down.  You have pain, redness, or warmth in the swollen areas.  You have heart, liver, or kidney disease and get edema all of a sudden.  You have a fever and your symptoms get worse all of a sudden. Summary  Edema is when you have too much fluid in your body or under your skin.  Edema may make your legs, feet, and ankles swell up. Swelling is also common in looser tissues, like around your eyes.  Raise (elevate) the swollen body part above the level of your  heart when you are sitting or lying down.  Follow your doctor's instructions about diet and how much fluid you can drink (fluid restriction). This information is not intended to replace advice given to you by your health care provider. Make sure you discuss any questions you have with your health care provider. Document Released: 12/12/2007 Document Revised: 07/13/2016 Document Reviewed: 07/13/2016 Elsevier Interactive Patient Education  2017 Elsevier Inc.  

## 2018-04-07 NOTE — Progress Notes (Signed)
Subjective:  Patient ID: Shawn Mccall, male    DOB: August 29, 1950  Age: 67 y.o. MRN: [id number]  CC: Edema in feet/ankles (x 6 weeks) and Establish Care (had shingles right eyelid in June)   HPI Shawn Mccall presents for new patient evaluation.  He has a history of L5-S1 herniated nucleus pulposus for which she had surgery in the 1990s.  He says that the disc was wrapped around his sciatic nerve.  More recently he has noted that his feet are numb although that started back in.  They do have a tingling like they are asleep as well.  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  After the tick bites about 6 weeks ago he was given doxycycline and then he had a reaction to that with hives.  He did not have shortness of breath or angioedema.  His current main concern is the swelling in his feet and legs that is been going on for 6 weeks.  He denies shortness of breath.  He denies chest pain.  He does not have any abdominal swelling nor has he noted any skin pigment change from jaundice.  He has not had itching of the skin.  He does note that he has had some breaking out on his legs that has been there for years.  Depression screen PHQ 2/9 04/07/2018  Decreased Interest 0  Down, Depressed, Hopeless 0  PHQ - 2 Score 0    History Shawn Mccall has a past medical history of AVM (arteriovenous malformation) and Kidney stones.   He has a past surgical history that includes Back surgery (1998) and Inguinal hernia repair (1970).   His family history is not on file.He reports that he quit smoking about 2 months ago. He smoked 0.50 packs per day. He has never used smokeless tobacco. He reports that he does not drink alcohol. His drug history is not on file.    ROS Review of Systems  Constitutional: Negative.   HENT: Negative.   Eyes: Negative for visual disturbance.  Respiratory: Negative for cough and shortness of breath.   Cardiovascular: Positive for leg swelling. Negative for chest pain.    Gastrointestinal: Negative for abdominal pain, diarrhea, nausea and vomiting.  Genitourinary: Negative for difficulty urinating.  Musculoskeletal: Negative for arthralgias and myalgias.  Skin: Negative for rash.  Neurological: Negative for headaches.  Psychiatric/Behavioral: Negative for sleep disturbance.    Objective:  BP 122/77   Pulse 94   Temp (!) 97 F (36.1 C) (Oral)   Ht 6' 0.5" (1.842 m)   Wt 225 lb (102.1 kg)   BMI 30.10 kg/m   BP Readings from Last 3 Encounters:  04/07/18 122/77  01/29/17 122/78  05/10/15 (!) 148/81    Wt Readings from Last 3 Encounters:  04/07/18 225 lb (102.1 kg)  01/29/17 209 lb 3.2 oz (94.9 kg)  05/10/15 223 lb 6.4 oz (101.3 kg)     Physical Exam  Constitutional: He is oriented to person, place, and time. He appears well-developed and well-nourished. No distress.  HENT:  Head: Normocephalic and atraumatic.  Right Ear: External ear normal.  Left Ear: External ear normal.  Nose: Nose normal.  Mouth/Throat: Oropharynx is clear and moist.  Eyes: Pupils are equal, round, and reactive to light. Conjunctivae and EOM are normal.  Neck: Normal range of motion. Neck supple.  Cardiovascular: Normal rate, regular rhythm and normal heart sounds.  No murmur heard. Pulmonary/Chest: Effort normal and breath sounds normal. No respiratory  distress. He has no wheezes. He has no rales.  Abdominal: Soft. There is no tenderness.  Musculoskeletal: Normal range of motion. He exhibits edema (3+ pitting to the upper one third of the lower leg bilaterally.  The dorsal foot is swollen to the point of losing all landmarks.).  Neurological: He is alert and oriented to person, place, and time. He has normal reflexes.  Skin: Skin is warm and dry. Rash (There is chronic stasis pigment changes from iron deposit both lower extremities) noted.  Psychiatric: He has a normal mood and affect. His behavior is normal. Judgment and thought content normal.      Assessment  & Plan:   Shawn Mccall was seen today for edema in feet/ankles and establish care.  Diagnoses and all orders for this visit:  Edema, unspecified type -     Brain natriuretic peptide -     CMP14+EGFR -     Lipid panel -     PSA, total and free -     VAS [place] LOWER EXTREMITY VENOUS (DVT); Future  Rash -     Brain natriuretic peptide -     CMP14+EGFR -     Lipid panel -     PSA, total and free  Cough -     Brain natriuretic peptide -     CMP14+EGFR -     Lipid panel -     PSA, total and free -     DG Chest 2 View; Future  Tick bite, initial encounter -     Lyme Ab/Western Blot Reflex -     Rocky mtn spotted fvr abs pnl(IgG+IgM) -     Brain natriuretic peptide -     CMP14+EGFR -     Lipid panel -     PSA, total and free  Other orders -     buPROPion (WELLBUTRIN SR) 150 MG 12 hr tablet; Take 1 tablet (150 mg total) by mouth 2 (two) times daily.       I have discontinued Shawn Mccall's meclizine. I am also having him maintain his Multiple Vitamins-Minerals (MULTIVITAMIN ADULT PO), aspirin EC, fluticasone, and buPROPion.  Allergies as of 04/07/2018      Reactions   Contrast Media [iodinated Diagnostic Agents]       Medication List        Accurate as of 04/07/18  6:13 PM. Always use your most recent med list.          aspirin EC 81 MG tablet Take 1 tablet (81 mg total) by mouth daily.   buPROPion 150 MG 12 hr tablet Commonly known as:  WELLBUTRIN SR Take 1 tablet (150 mg total) by mouth 2 (two) times daily.   fluticasone 50 MCG/ACT nasal spray Commonly known as:  FLONASE Place 1 spray into both nostrils daily.   MULTIVITAMIN ADULT PO Take 1 tablet by mouth daily.        Follow-up: Return in about 1 month (around 05/07/2018).  Claretta Fraise, M.D.

## 2018-04-08 ENCOUNTER — Other Ambulatory Visit (INDEPENDENT_AMBULATORY_CARE_PROVIDER_SITE_OTHER): Payer: Medicare Other

## 2018-04-08 DIAGNOSIS — R05 Cough: Secondary | ICD-10-CM

## 2018-04-08 DIAGNOSIS — R059 Cough, unspecified: Secondary | ICD-10-CM

## 2018-04-09 LAB — LIPID PANEL
CHOLESTEROL TOTAL: 134 mg/dL (ref 100–199)
Chol/HDL Ratio: 3.8 ratio (ref 0.0–5.0)
HDL: 35 mg/dL — ABNORMAL LOW (ref >39)
LDL Calculated: 66 mg/dL (ref 0–99)
Triglycerides: 167 mg/dL — ABNORMAL HIGH (ref 0–149)
VLDL CHOLESTEROL CAL: 33 mg/dL (ref 5–40)

## 2018-04-09 LAB — CMP14+EGFR
A/G RATIO: 1.9 (ref 1.2–2.2)
ALBUMIN: 4.2 g/dL (ref 3.6–4.8)
ALT: 19 IU/L (ref 0–44)
AST: 21 IU/L (ref 0–40)
Alkaline Phosphatase: 58 IU/L (ref 39–117)
BUN/Creatinine Ratio: 15 (ref 10–24)
BUN: 18 mg/dL (ref 8–27)
Bilirubin Total: 0.4 mg/dL (ref 0.0–1.2)
CALCIUM: 9.8 mg/dL (ref 8.6–10.2)
CO2: 26 mmol/L (ref 20–29)
CREATININE: 1.23 mg/dL (ref 0.76–1.27)
Chloride: 106 mmol/L (ref 96–106)
GFR, EST AFRICAN AMERICAN: 69 mL/min/{1.73_m2} (ref >59)
GFR, EST NON AFRICAN AMERICAN: 60 mL/min/{1.73_m2} (ref >59)
Globulin, Total: 2.2 g/dL (ref 1.5–4.5)
Glucose: 112 mg/dL — ABNORMAL HIGH (ref 65–99)
Potassium: 4.5 mmol/L (ref 3.5–5.2)
Sodium: 146 mmol/L — ABNORMAL HIGH (ref 134–144)
TOTAL PROTEIN: 6.4 g/dL (ref 6.0–8.5)

## 2018-04-09 LAB — BRAIN NATRIURETIC PEPTIDE: BNP: 10.4 pg/mL (ref 0.0–100.0)

## 2018-04-09 LAB — PSA, TOTAL AND FREE
PSA, Free Pct: 25 %
PSA, Free: 0.2 ng/mL
Prostate Specific Ag, Serum: 0.8 ng/mL (ref 0.0–4.0)

## 2018-04-09 LAB — ROCKY MTN SPOTTED FVR ABS PNL(IGG+IGM)
RMSF IgG: UNDETERMINED
RMSF IgM: 0.28 index (ref 0.00–0.89)

## 2018-04-09 LAB — LYME AB/WESTERN BLOT REFLEX: LYME DISEASE AB, QUANT, IGM: <0.8 index (ref 0.00–0.79)

## 2018-04-09 LAB — RMSF, IGG, IFA: RMSF, IGG, IFA: 1:64 {titer} — ABNORMAL HIGH

## 2018-04-11 ENCOUNTER — Telehealth: Payer: Self-pay | Admitting: Family Medicine

## 2018-04-11 NOTE — Telephone Encounter (Signed)
There is no referral noted in chart.  Please advise and order referral if warranted.

## 2018-04-14 ENCOUNTER — Other Ambulatory Visit: Payer: Self-pay | Admitting: Family Medicine

## 2018-04-14 DIAGNOSIS — R202 Paresthesia of skin: Principal | ICD-10-CM

## 2018-04-14 DIAGNOSIS — R200 Anesthesia of skin: Secondary | ICD-10-CM

## 2018-04-14 NOTE — Telephone Encounter (Signed)
Pt aware of appointment date/time 

## 2018-04-14 NOTE — Telephone Encounter (Signed)
I think the test was ordered incorrectly, but he does need venous doppler, BLE please. WS

## 2018-04-17 ENCOUNTER — Ambulatory Visit (HOSPITAL_COMMUNITY)
Admission: RE | Admit: 2018-04-17 | Discharge: 2018-04-17 | Disposition: A | Discharge status: Home / Self Care | Payer: Medicare Other | Source: Ambulatory Visit | Attending: Family Medicine | Admitting: Family Medicine

## 2018-04-17 DIAGNOSIS — R600 Localized edema: Secondary | ICD-10-CM | POA: Diagnosis not present

## 2018-04-17 DIAGNOSIS — R200 Anesthesia of skin: Secondary | ICD-10-CM | POA: Diagnosis not present

## 2018-04-17 DIAGNOSIS — R202 Paresthesia of skin: Secondary | ICD-10-CM | POA: Diagnosis not present

## 2018-05-08 ENCOUNTER — Ambulatory Visit (INDEPENDENT_AMBULATORY_CARE_PROVIDER_SITE_OTHER): Payer: Medicare Other | Admitting: Family Medicine

## 2018-05-08 ENCOUNTER — Encounter: Payer: Self-pay | Admitting: Family Medicine

## 2018-05-08 VITALS — BP 127/76 | HR 77 | Temp 97.5°F | Ht 72.5 in | Wt 229.0 lb

## 2018-05-08 DIAGNOSIS — R609 Edema, unspecified: Secondary | ICD-10-CM | POA: Diagnosis not present

## 2018-05-08 MED ORDER — NABUMETONE 500 MG PO TABS: 1000.0000 mg | ORAL_TABLET | Freq: Two times a day (BID) | ORAL | 1 refills | Status: DC

## 2018-05-08 NOTE — Progress Notes (Signed)
Chief Complaint  Patient presents with  . Follow-up  . Back Pain    HPI  Patient presents today for acute on chronic low back pain. On feet a lot. Moderate pain. Present about a month. INtermittent. Getting worse.  Swelling is much better. Almost gone.  PMH: Smoking status noted ROS: Per HPI  Objective: BP 127/76   Pulse 77   Temp (!) 97.5 F (36.4 C)   Ht 6' 0.5" (1.842 m)   Wt 229 lb (103.9 kg)   BMI 30.63 kg/m  Gen: NAD, alert, cooperative with exam HEENT: NCAT, EOMI, PERRL CV: RRR, good S1/S2, no murmur Resp: CTABL, no wheezes, non-labored Abd: SNTND, BS present, no guarding or organomegaly Ext:1+ edema, warm Neuro: Alert and oriented, No gross deficits  Assessment and plan:  1. Edema, unspecified type     Meds ordered this encounter  Medications  . nabumetone (RELAFEN) 500 MG tablet    Sig: Take 2 tablets (1,000 mg total) by mouth 2 (two) times daily. For muscle and joint pain    Dispense:  360 tablet    Refill:  1    No orders of the defined types were placed in this encounter.   Follow up as needed.  Claretta Fraise, MD

## 2018-05-08 NOTE — Patient Instructions (Signed)
Follow up with Dr. Livia Snellen as needed.

## 2018-05-20 ENCOUNTER — Ambulatory Visit (INDEPENDENT_AMBULATORY_CARE_PROVIDER_SITE_OTHER): Payer: Medicare Other | Admitting: Family Medicine

## 2018-05-20 ENCOUNTER — Encounter: Payer: Self-pay | Admitting: Family Medicine

## 2018-05-20 VITALS — BP 137/70 | HR 78 | Temp 97.2°F | Ht 72.5 in | Wt 226.8 lb

## 2018-05-20 DIAGNOSIS — D224 Melanocytic nevi of scalp and neck: Secondary | ICD-10-CM

## 2018-05-20 DIAGNOSIS — D3611 Benign neoplasm of peripheral nerves and autonomic nervous system of face, head, and neck: Secondary | ICD-10-CM | POA: Diagnosis not present

## 2018-05-20 DIAGNOSIS — D229 Melanocytic nevi, unspecified: Secondary | ICD-10-CM

## 2018-05-20 NOTE — Progress Notes (Signed)
BP 137/70   Pulse 78   Temp (!) 97.2 F (36.2 C) (Oral)   Ht 6' 0.5" (1.842 m)   Wt 226 lb 12.8 oz (102.9 kg)   BMI 30.34 kg/m    Subjective:    Patient ID: Shawn Mccall, male    DOB: 1950/07/26, 67 y.o.   MRN: [id number]  HPI: Shawn Mccall is a 67 y.o. male presenting on 05/20/2018 for mole removed (Left jawline- patient states it has been there for 20 years)   HPI Left jawline skin spot Patient is coming in today because he has a spot on his left neck near his lower jawline that has been bothering him for some time.  He says he will frequently catch it while shaving and that it will bleed and he wants to see about having it removed.  He denies any changes with it has been there for 20 years and it has not grown in size or changed in color at all.  Relevant past medical, surgical, family and social history reviewed and updated as indicated. Interim medical history since our last visit reviewed. Allergies and medications reviewed and updated.  Review of Systems  Constitutional: Negative for chills and fever.  Eyes: Negative for visual disturbance.  Respiratory: Negative for shortness of breath and wheezing.   Cardiovascular: Negative for chest pain and leg swelling.  Musculoskeletal: Negative for back pain and gait problem.  Skin: Negative for rash.  All other systems reviewed and are negative.   Per HPI unless specifically indicated above   Allergies as of 05/20/2018      Reactions   Contrast Media [iodinated Diagnostic Agents]       Medication List        Accurate as of 05/20/18  1:13 PM. Always use your most recent med list.          aspirin EC 81 MG tablet Take 1 tablet (81 mg total) by mouth daily.   buPROPion 150 MG 12 hr tablet Commonly known as:  WELLBUTRIN SR Take 1 tablet (150 mg total) by mouth 2 (two) times daily.   fluticasone 50 MCG/ACT nasal spray Commonly known as:  FLONASE Place 1 spray into both nostrils daily.   MULTIVITAMIN ADULT  PO Take 1 tablet by mouth daily.   nabumetone 500 MG tablet Commonly known as:  RELAFEN Take 2 tablets (1,000 mg total) by mouth 2 (two) times daily. For muscle and joint pain   naproxen sodium 220 MG tablet Commonly known as:  ALEVE Take 220 mg by mouth daily as needed.          Objective:    BP 137/70   Pulse 78   Temp (!) 97.2 F (36.2 C) (Oral)   Ht 6' 0.5" (1.842 m)   Wt 226 lb 12.8 oz (102.9 kg)   BMI 30.34 kg/m   Wt Readings from Last 3 Encounters:  05/20/18 226 lb 12.8 oz (102.9 kg)  05/08/18 229 lb (103.9 kg)  04/07/18 225 lb (102.1 kg)    Physical Exam  Constitutional: He is oriented to person, place, and time. He appears well-developed and well-nourished. No distress.  Eyes: Conjunctivae are normal. No scleral icterus.  Musculoskeletal: Normal range of motion. He exhibits no edema.  Neurological: He is alert and oriented to person, place, and time. Coordination normal.  Skin: Skin is warm and dry. Lesion (Benign looking nevus on left side of neck, a pigmented and raised nevus, less than 0.15 cm in size) noted.  No rash noted. He is not diaphoretic.  Psychiatric: He has a normal mood and affect. His behavior is normal.  Nursing note and vitals reviewed.  Skin lesion removal: Verbal consent was obtained.  Betadine was used for cleansing.  2cc of 2% lidocaine without epinephrine was used for anesthesia.  Shave biopsy was performed with good margins.  Silver nitrate stick was used to achieve hemostasis.  Topical antibiotic was used and then it was covered by simple bandage told in place. Procedure was tolerated well.  Bleeding was minimal     Assessment & Plan:   Problem List Items Addressed This Visit    None    Visit Diagnoses    Benign nevus of skin    -  Primary   Relevant Orders   Pathology      Non-concerning skin nevus, did a shave biopsy Follow up plan: Return if symptoms worsen or fail to improve.  Counseling provided for all of the vaccine  components No orders of the defined types were placed in this encounter.   Caryl Pina, MD [facility] 05/20/2018, 1:13 PM

## 2018-05-22 LAB — PATHOLOGY

## 2018-10-08 DEATH — deceased

## 2018-10-23 ENCOUNTER — Encounter: Payer: Self-pay | Admitting: Family Medicine

## 2018-10-23 ENCOUNTER — Other Ambulatory Visit: Payer: Self-pay | Admitting: Family Medicine

## 2018-10-23 MED ORDER — CELECOXIB 200 MG PO CAPS: 200.0000 mg | ORAL_CAPSULE | Freq: Every day | ORAL | 5 refills | Status: DC

## 2018-10-24 ENCOUNTER — Other Ambulatory Visit: Payer: Self-pay

## 2018-10-24 ENCOUNTER — Encounter: Payer: Self-pay | Admitting: Family Medicine

## 2018-10-24 ENCOUNTER — Telehealth: Payer: Self-pay

## 2018-10-24 ENCOUNTER — Ambulatory Visit (INDEPENDENT_AMBULATORY_CARE_PROVIDER_SITE_OTHER): Payer: Medicare Other | Admitting: Family Medicine

## 2018-10-24 DIAGNOSIS — R609 Edema, unspecified: Secondary | ICD-10-CM | POA: Diagnosis not present

## 2018-10-24 DIAGNOSIS — M545 Low back pain, unspecified: Secondary | ICD-10-CM

## 2018-10-24 MED ORDER — MELOXICAM 15 MG PO TABS: 15.0000 mg | ORAL_TABLET | Freq: Every day | ORAL | 5 refills | Status: DC

## 2018-10-24 NOTE — Progress Notes (Signed)
Subjective:    Patient ID: Shawn Mccall, male    DOB: 1951-05-29, 68 y.o.   MRN: [id number]  CC: Leg Swelling   HPI: Shawn Mccall is a 68 y.o. male presenting for Leg Swelling  Low back pain treated by nabumetone. Has flares ever since lumbar surgery in the past. Occasional flare with simply bending over. Seems better . Now having moderate swelling in the feet and ankles bilaterally. No erythema. Not accompanied by fatigue or dyspnea. No chest pain. Noted that it started after being prescribed nabumetone. Would like to change meds to see if that will relieve the swelling. However his back pain is still significant so he needs something as a substitute.    Depression screen [facility] 2/9 05/20/2018 05/08/2018 04/07/2018  Decreased Interest 0 3 0  Down, Depressed, Hopeless 0 3 0  PHQ - 2 Score 0 6 0  Altered sleeping - 0 -  Tired, decreased energy - 0 -  Change in appetite - 0 -  Feeling bad or failure about yourself  - 0 -  Trouble concentrating - 0 -  Moving slowly or fidgety/restless - 0 -  Suicidal thoughts - 0 -  PHQ-9 Score - 6 -     Relevant past medical, surgical, family and social history reviewed and updated as indicated.  Interim medical history since our last visit reviewed. Allergies and medications reviewed and updated.  ROS:  Review of Systems  Constitutional: Negative for fever.  Respiratory: Negative for shortness of breath.   Cardiovascular: Positive for leg swelling. Negative for chest pain.  Musculoskeletal: Positive for back pain and myalgias. Negative for arthralgias.  Skin: Negative for rash.     Social History   Tobacco Use  Smoking Status Former Smoker  . Packs/day: 0.50  . Last attempt to quit: 01/09/2018  . Years since quitting: 0.7  Smokeless Tobacco Never Used       Objective:     Wt Readings from Last 3 Encounters:  05/20/18 226 lb 12.8 oz (102.9 kg)  05/08/18 229 lb (103.9 kg)  04/07/18 225 lb (102.1 kg)     Exam deferred. Pt.  [place] due to COVID 19. Phone visit performed.   Assessment & Plan:    Meshilem was seen today for leg swelling.  Diagnoses and all orders for this visit:  Edema, unspecified type  Lumbar pain  Other orders -     meloxicam (MOBIC) 15 MG tablet; Take 1 tablet (15 mg total) by mouth daily. For joint and muscle pain   Virtual Visit via telephone Note  I discussed the limitations, risks, security and privacy concerns of performing an evaluation and management service by telephone and the availability of in person appointments. The patient expressed understanding and agreed to proceed. Pt. Is at home. Dr. Livia Snellen is in his office.  Follow Up Instructions:   I discussed the assessment and treatment plan with the patient. The patient was provided an opportunity to ask questions and all were answered. The patient agreed with the plan and demonstrated an understanding of the instructions.   The patient was advised to call back or seek an in-person evaluation if the symptoms worsen or if the condition fails to improve as anticipated.  Visit started: 3:25 Call ended:  3:39 Total minutes including chart review and phone contact time: 18   Follow up plan: Return in about 6 months (around 04/25/2019), or if symptoms worsen or fail to improve.  Claretta Fraise, MD [facility] 10/24/2018, 8:52 PM

## 2018-10-24 NOTE — Telephone Encounter (Signed)
I sent in the requested prescription 

## 2018-10-24 NOTE — Telephone Encounter (Signed)
Pharmacy called wanting a different rx for patient. He doesn't have insurance and the cost of the celebrex is 106. Can this be changed?

## 2019-06-17 IMAGING — US US EXTREM LOW VENOUS BILAT
1 series · 13 of 24 positions shown · non-contrast
Comparison: None.

CLINICAL DATA: Bilateral lower extremity edema and paresthesias.
Former smoker. Evaluate for DVT.



[Series 1: us extrem low venous bilat · 13 of 67 slices shown]
[im 1/67]
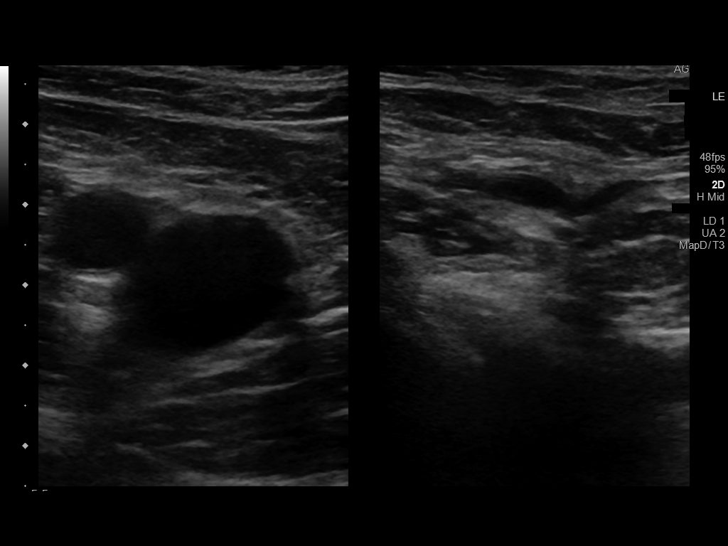
[im 6/67]
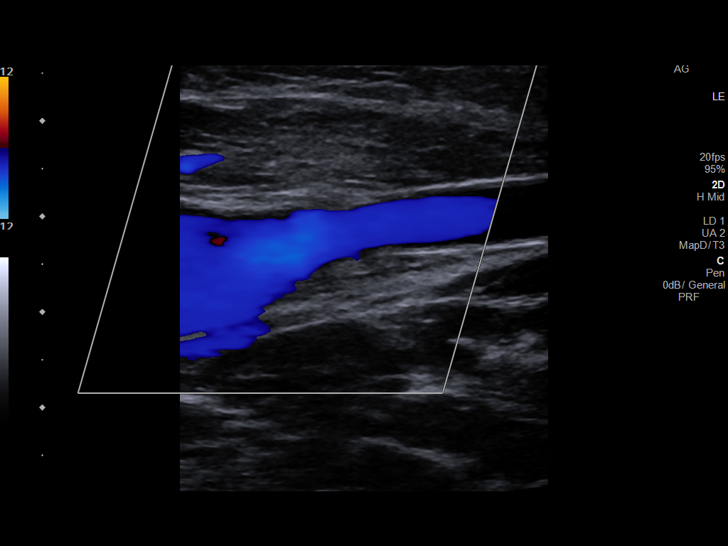
[im 12/67]
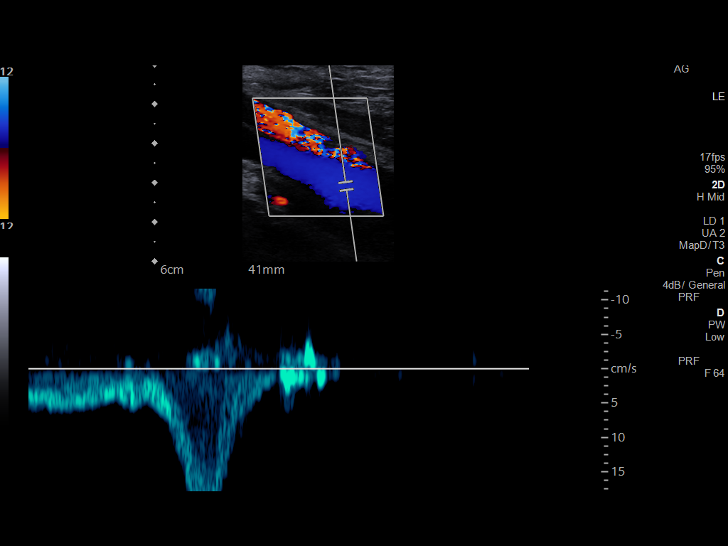
[im 18/67]
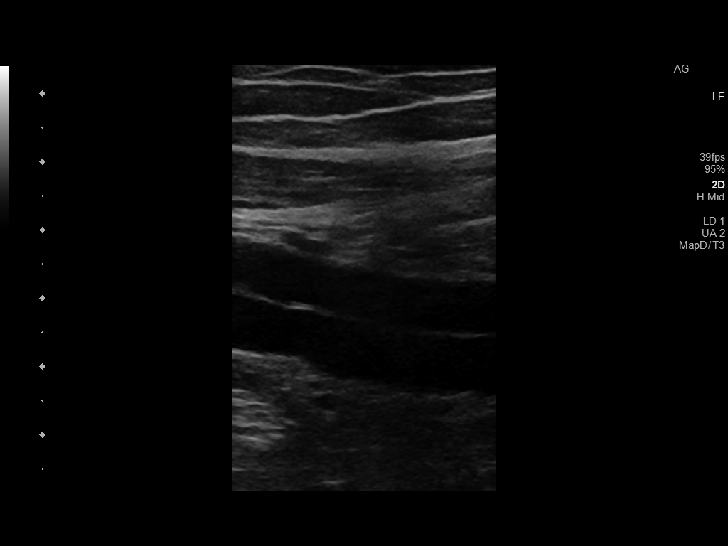
[im 23/67]
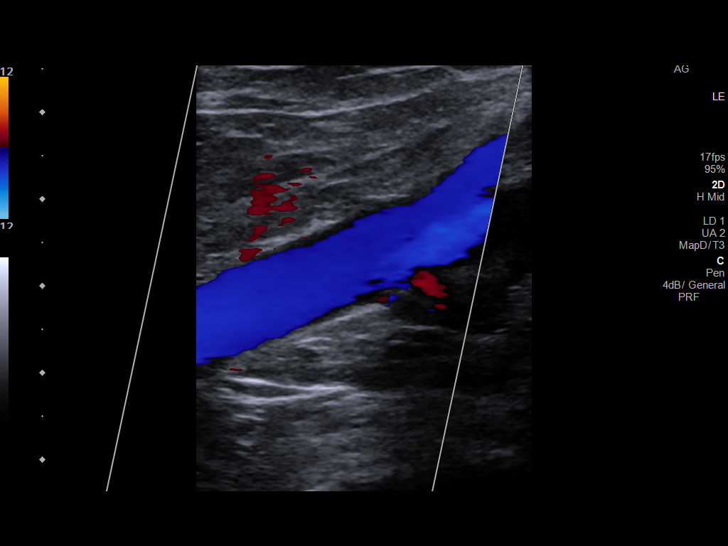
[im 29/67]
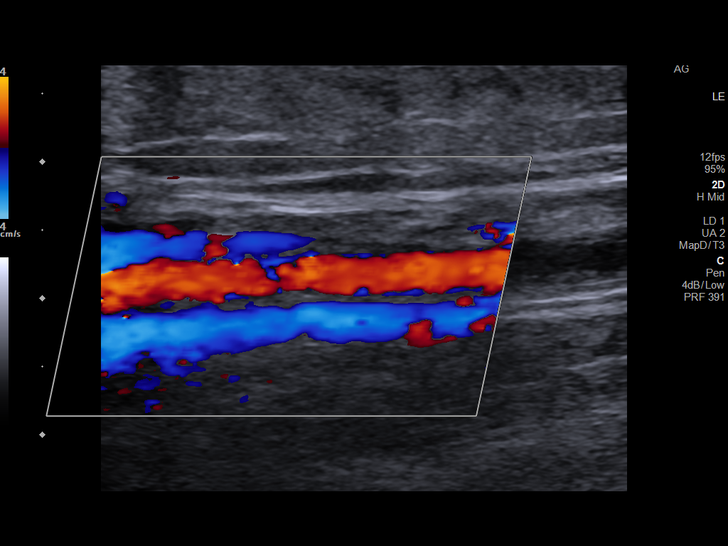
[im 35/67]
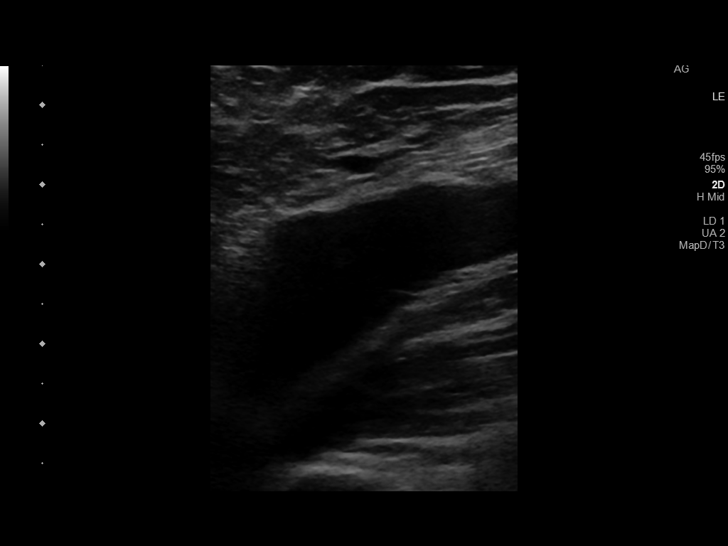
[im 38/67]
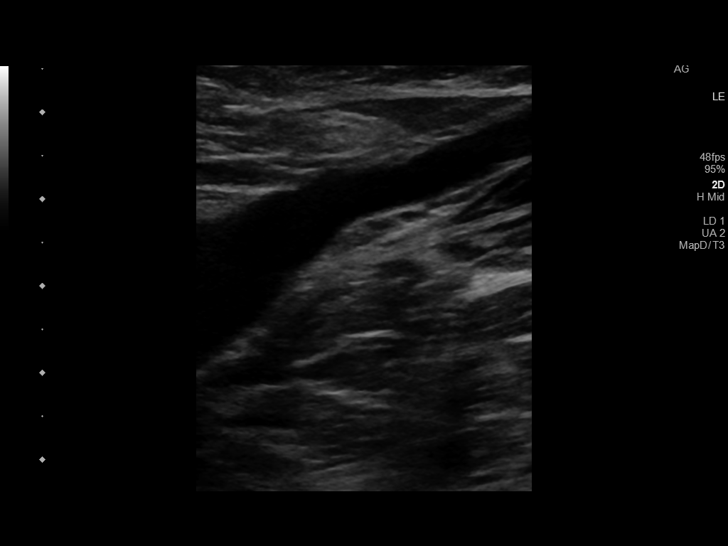
[im 44/67]
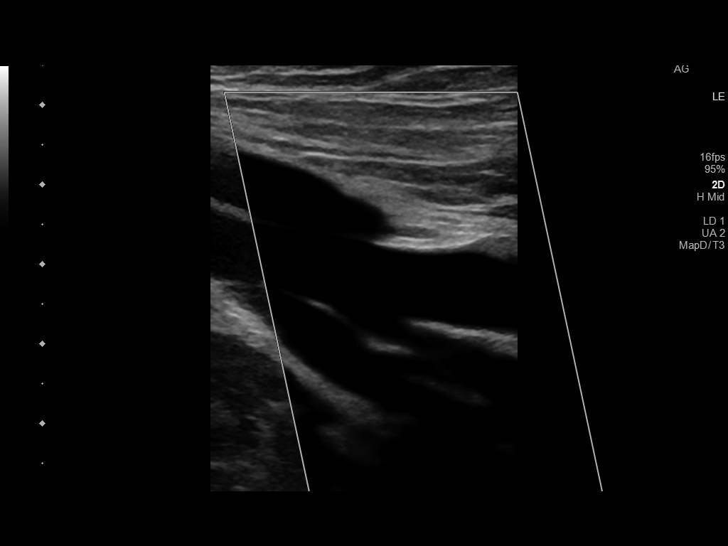
[im 49/67]
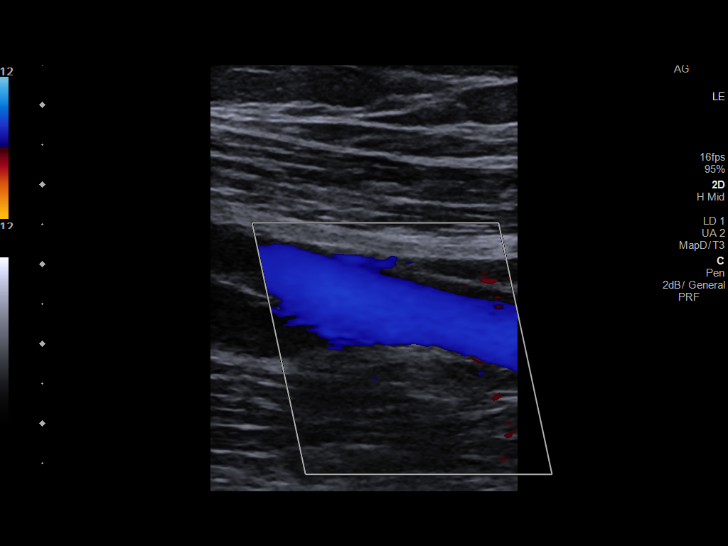
[im 55/67]
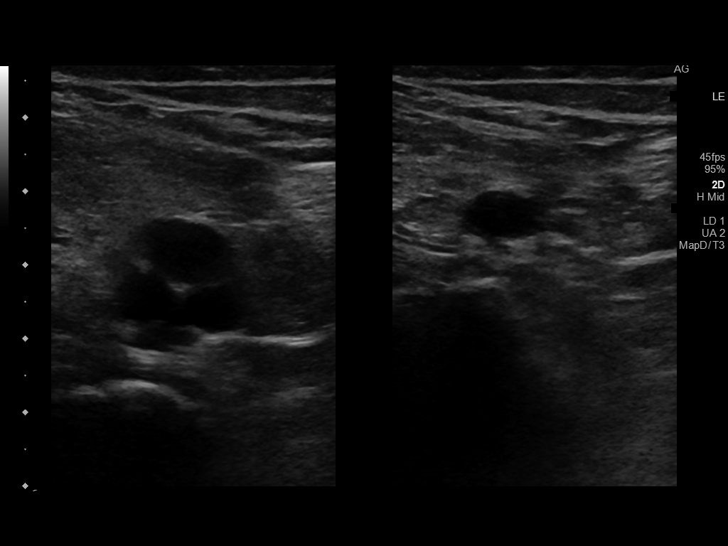
[im 61/67]
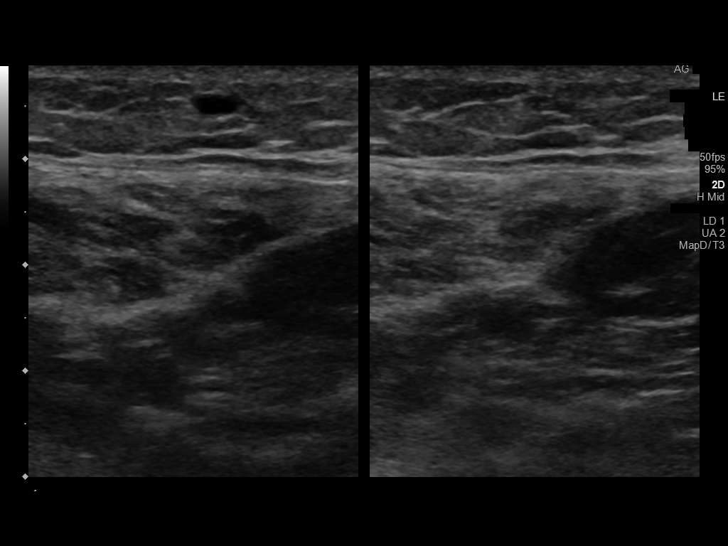
[im 67/67]
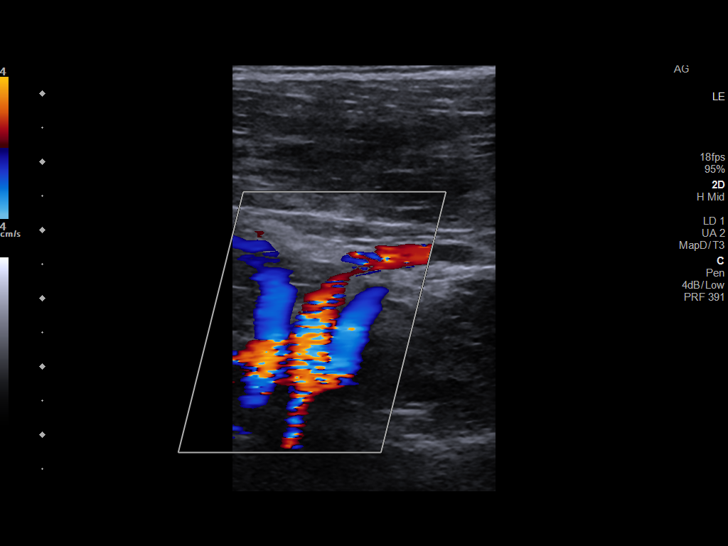

[13 of 24 positions shown; findings below may reference images not displayed]

FINDINGS: RIGHT LOWER EXTREMITY

Common Femoral Vein: No evidence of thrombus. Normal
compressibility, respiratory phasicity and response to augmentation.

Saphenofemoral Junction: No evidence of thrombus. Normal
compressibility and flow on color Doppler imaging.

Profunda Femoral Vein: No evidence of thrombus. Normal
compressibility and flow on color Doppler imaging.

Femoral Vein: No evidence of thrombus. Normal compressibility,
respiratory phasicity and response to augmentation.

Popliteal Vein: No evidence of thrombus. Normal compressibility,
respiratory phasicity and response to augmentation.

Calf Veins: No evidence of thrombus. Normal compressibility and flow
on color Doppler imaging.

Superficial Great Saphenous Vein: No evidence of thrombus. Normal
compressibility.

Venous Reflux:  None.

Other Findings:  None.

LEFT LOWER EXTREMITY

Common Femoral Vein: No evidence of thrombus. Normal
compressibility, respiratory phasicity and response to augmentation.

Saphenofemoral Junction: No evidence of thrombus. Normal
compressibility and flow on color Doppler imaging.

Profunda Femoral Vein: No evidence of thrombus. Normal
compressibility and flow on color Doppler imaging.

Femoral Vein: No evidence of thrombus. Normal compressibility,
respiratory phasicity and response to augmentation.

Popliteal Vein: No evidence of thrombus. Normal compressibility,
respiratory phasicity and response to augmentation.

Calf Veins: No evidence of thrombus. Normal compressibility and flow
on color Doppler imaging.

Superficial Great Saphenous Vein: No evidence of thrombus. Normal
compressibility.

Venous Reflux:  None.

Other Findings:  None.
IMPRESSION: No evidence of DVT within either lower extremity.

## 2019-09-04 ENCOUNTER — Ambulatory Visit: Payer: Medicare Other | Attending: Internal Medicine

## 2019-09-04 DIAGNOSIS — Z23 Encounter for immunization: Secondary | ICD-10-CM | POA: Insufficient documentation

## 2019-09-04 NOTE — Progress Notes (Signed)
   Covid-19 Vaccination Clinic  Name:  Shawn Mccall    MRN: [id number] DOB: 02/14/1951  09/04/2019  Shawn Mccall was observed post Covid-19 immunization for 15 minutes without incidence. He was provided with Vaccine Information Sheet and instruction to access the V-Safe system.   Shawn Mccall was instructed to call 911 with any severe reactions post vaccine: [name] Kitchen Difficulty breathing  . Swelling of your face and throat  . A fast heartbeat  . A bad rash all over your body  . Dizziness and weakness    Immunizations Administered    Name Date Dose VIS Date Route   Pfizer COVID-19 Vaccine 09/04/2019  9:47 AM 0.3 mL 06/19/2019 Intramuscular   Manufacturer: [place]   Lot: [id number]   [name]: [id number]

## 2019-09-29 ENCOUNTER — Ambulatory Visit: Payer: Medicare Other | Attending: Internal Medicine

## 2019-09-29 DIAGNOSIS — Z23 Encounter for immunization: Secondary | ICD-10-CM

## 2019-09-29 NOTE — Progress Notes (Signed)
   Covid-19 Vaccination Clinic  Name:  Shawn Mccall    MRN: [id number] DOB: September 17, 1950  09/29/2019  [name] was observed post Covid-19 immunization for    Covid-19 Vaccination Clinic  Name:  Shawn Mccall    MRN: [id number] DOB: 07-24-1950  09/29/2019  [name] was observed post Covid-19 immunization for 15 minutes without incident. He was provided with Vaccine Information Sheet and instruction to access the V-Safe system.   [name] was instructed to call 911 with any severe reactions post vaccine: [name] Kitchen Difficulty breathing  . Swelling of face and throat  . A fast heartbeat  . A bad rash all over body  . Dizziness and weakness   Immunizations Administered    Name Date Dose VIS Date Route   Pfizer COVID-19 Vaccine 09/29/2019 11:37 AM 0.3 mL 06/19/2019 Intramuscular   Manufacturer: [place]   Lot: [id number]   [place]: [id number]     without incident. He was provided with Vaccine Information Sheet and instruction to access the V-Safe system.   [name] was instructed to call 911 with any severe reactions post vaccine: [name] Kitchen Difficulty breathing  . Swelling of face and throat  . A fast heartbeat  . A bad rash all over body  . Dizziness and weakness   Immunizations Administered    Name Date Dose VIS Date Route   Pfizer COVID-19 Vaccine 09/29/2019 11:37 AM 0.3 mL 06/19/2019 Intramuscular   Manufacturer: [place]   Lot: [id number]   [place]: [facility]  Name:  Shawn Mccall    MRN: [id number] DOB: [birth year]  09/29/2019  [name] was observed post Covid-19 immunization for 15 minutes without incident. He was provided with Vaccine Information Sheet and instruction to access the V-Safe system.   [name] was instructed to call 911 with any severe reactions post vaccine: [name] Kitchen Difficulty breathing  . Swelling of face and throat  . A fast heartbeat  . A bad rash all over body  . Dizziness and weakness   Immunizations Administered    Name  Date Dose VIS Date Route   Pfizer COVID-19 Vaccine 09/29/2019 11:37 AM 0.3 mL 06/19/2019 Intramuscular   Manufacturer: [place]   Lot: [id number]   [place]: [id number]

## 2019-12-01 ENCOUNTER — Encounter: Payer: Self-pay | Admitting: Family Medicine

## 2019-12-01 MED ORDER — MELOXICAM 15 MG PO TABS: 15.0000 mg | ORAL_TABLET | Freq: Every day | ORAL | 0 refills | Status: DC

## 2019-12-02 ENCOUNTER — Encounter: Payer: Self-pay | Admitting: Family Medicine

## 2019-12-02 ENCOUNTER — Other Ambulatory Visit: Payer: Self-pay | Admitting: Family Medicine

## 2019-12-02 ENCOUNTER — Other Ambulatory Visit: Payer: Self-pay

## 2019-12-02 ENCOUNTER — Ambulatory Visit (INDEPENDENT_AMBULATORY_CARE_PROVIDER_SITE_OTHER): Payer: Medicare Other | Admitting: Family Medicine

## 2019-12-02 VITALS — BP 136/81 | HR 80 | Temp 98.0°F | Resp 20 | Ht 72.5 in | Wt 231.5 lb

## 2019-12-02 DIAGNOSIS — Z13220 Encounter for screening for lipoid disorders: Secondary | ICD-10-CM

## 2019-12-02 DIAGNOSIS — G609 Hereditary and idiopathic neuropathy, unspecified: Secondary | ICD-10-CM | POA: Diagnosis not present

## 2019-12-02 DIAGNOSIS — Z1211 Encounter for screening for malignant neoplasm of colon: Secondary | ICD-10-CM | POA: Diagnosis not present

## 2019-12-02 DIAGNOSIS — I6930 Unspecified sequelae of cerebral infarction: Secondary | ICD-10-CM

## 2019-12-02 DIAGNOSIS — Z125 Encounter for screening for malignant neoplasm of prostate: Secondary | ICD-10-CM | POA: Diagnosis not present

## 2019-12-02 DIAGNOSIS — I639 Cerebral infarction, unspecified: Secondary | ICD-10-CM

## 2019-12-02 NOTE — Progress Notes (Signed)
Subjective:  Patient ID: Shawn Mccall, male    DOB: 1950-09-03  Age: 69 y.o. MRN: [id number]  CC: Medication Refill   HPI SHIRLEY BOLLE presents for complete physical examination.  His main concern is that he does have some neuropathy and that causes dull sensation to numbness in the toes primarily in the right third fourth and fifth toes.  He has history of cerebellar stroke several years ago.  He is a bit unsteady as result.  He denies any recent vertigo.  He is not having arthritic pain anymore and wonders if he should continue Popik.  Additionally he is due for routine blood work and referral for colonoscopy.  Depression screen [facility] 2/9 12/02/2019 05/20/2018 05/08/2018  Decreased Interest 0 0 3  Down, Depressed, Hopeless 0 0 3  PHQ - 2 Score 0 0 6  Altered sleeping - - 0  Tired, decreased energy - - 0  Change in appetite - - 0  Feeling bad or failure about yourself  - - 0  Trouble concentrating - - 0  Moving slowly or fidgety/restless - - 0  Suicidal thoughts - - 0  PHQ-9 Score - - 6    History Burech has a past medical history of AVM (arteriovenous malformation) and Kidney stones.   He has a past surgical history that includes Back surgery (1998) and Inguinal hernia repair (1970).   His family history is not on file.He reports that he quit smoking about 22 months ago. He smoked 0.50 packs per day. He has never used smokeless tobacco. He reports that he does not drink alcohol. No history on file for drug.    ROS Review of Systems  Constitutional: Negative for activity change, fatigue and unexpected weight change.  HENT: Negative for congestion, ear pain, hearing loss, postnasal drip and trouble swallowing.   Eyes: Negative for pain and visual disturbance.  Respiratory: Negative for cough, chest tightness and shortness of breath.   Cardiovascular: Negative for chest pain, palpitations and leg swelling.  Gastrointestinal: Negative for abdominal distention, abdominal pain,  blood in stool, constipation, diarrhea, nausea and vomiting.  Endocrine: Negative for cold intolerance, heat intolerance and polydipsia.  Genitourinary: Negative for difficulty urinating, dysuria, flank pain, frequency and urgency.  Musculoskeletal: Negative for arthralgias and joint swelling.  Skin: Negative for color change, rash and wound.  Neurological: Negative for dizziness, syncope, speech difficulty, weakness, light-headedness, numbness and headaches.  Hematological: Does not bruise/bleed easily.  Psychiatric/Behavioral: Negative for confusion, decreased concentration, dysphoric mood and sleep disturbance. The patient is not nervous/anxious.     Objective:  BP 136/81   Pulse 80   Temp 98 F (36.7 C) (Temporal)   Resp 20   Ht 6' 0.5" (1.842 m)   Wt 231 lb 8 oz (105 kg)   SpO2 98%   BMI 30.97 kg/m   BP Readings from Last 3 Encounters:  12/02/19 136/81  05/20/18 137/70  05/08/18 127/76    Wt Readings from Last 3 Encounters:  12/02/19 231 lb 8 oz (105 kg)  05/20/18 226 lb 12.8 oz (102.9 kg)  05/08/18 229 lb (103.9 kg)     Physical Exam Constitutional:      Appearance: He is well-developed.  HENT:     Head: Normocephalic and atraumatic.  Eyes:     Pupils: Pupils are equal, round, and reactive to light.  Neck:     Thyroid: No thyromegaly.     Trachea: No tracheal deviation.  Cardiovascular:     Rate and  Rhythm: Normal rate and regular rhythm.     Heart sounds: Normal heart sounds. No murmur. No friction rub. No gallop.   Pulmonary:     Breath sounds: Normal breath sounds. No wheezing or rales.  Abdominal:     General: Bowel sounds are normal. There is no distension.     Palpations: Abdomen is soft. There is no mass.     Tenderness: There is no abdominal tenderness.     Hernia: There is no hernia in the left inguinal area.  Genitourinary:    Penis: Normal.      Testes: Normal.  Musculoskeletal:        General: Normal range of motion.     Cervical back:  Normal range of motion.  Lymphadenopathy:     Cervical: No cervical adenopathy.  Skin:    General: Skin is warm and dry.  Neurological:     Mental Status: He is alert and oriented to person, place, and time.       Assessment & Plan:   Ramey was seen today for medication refill.  Diagnoses and all orders for this visit:  Idiopathic peripheral neuropathy -     CBC with Differential/Platelet -     CMP14+EGFR -     Lipid panel  Screening for prostate cancer -     PSA Total (Reflex To Free)  Screen for colon cancer -     Ambulatory referral to Gastroenterology  Cerebellar stroke ([place]) -     CBC with Differential/Platelet -     CMP14+EGFR -     Lipid panel  Screening for cholesterol level -     Lipid panel       I have discontinued [name]. Ballinger's fluticasone and buPROPion. I am also having him maintain his Multiple Vitamins-Minerals (MULTIVITAMIN ADULT PO), aspirin EC, Alpha-Lipoic Acid, and Melatonin.  Allergies as of 12/02/2019      Reactions   Contrast Media [iodinated Diagnostic Agents]       Medication List       Accurate as of Dec 02, 2019 11:59 PM. If you have any questions, ask your nurse or doctor.        STOP taking these medications   buPROPion 150 MG 12 hr tablet Commonly known as: Wellbutrin SR Stopped by: Claretta Fraise, MD   fluticasone 50 MCG/ACT nasal spray Commonly known as: FLONASE Stopped by: Claretta Fraise, MD     TAKE these medications   Alpha-Lipoic Acid 200 MG Caps Take 200 mg by mouth daily.   aspirin EC 81 MG tablet Take 1 tablet (81 mg total) by mouth daily.   Melatonin 10 MG Tabs Take 10 mg by mouth at bedtime.   meloxicam 15 MG tablet Commonly known as: MOBIC TAKE 1 TABLET BY MOUTH ONCE DAILY FOR JOINT AND MUSCLE PAIN What changed: See the new instructions. Changed by: Claretta Fraise, MD   MULTIVITAMIN ADULT PO Take 1 tablet by mouth daily.        Follow-up: Return in about 1 year (around 12/01/2020).   Claretta Fraise, M.D.

## 2019-12-03 ENCOUNTER — Other Ambulatory Visit: Payer: Self-pay | Admitting: Family Medicine

## 2019-12-03 LAB — CBC WITH DIFFERENTIAL/PLATELET
Basophils Absolute: 0 10*3/uL (ref 0.0–0.2)
Basos: 0 %
EOS (ABSOLUTE): 0.1 10*3/uL (ref 0.0–0.4)
Eos: 2 %
Hematocrit: 48.3 % (ref 37.5–51.0)
Hemoglobin: 16.1 g/dL (ref 13.0–17.7)
Immature Grans (Abs): 0 10*3/uL (ref 0.0–0.1)
Immature Granulocytes: 1 %
Lymphocytes Absolute: 1.7 10*3/uL (ref 0.7–3.1)
Lymphs: 28 %
MCH: 28.5 pg (ref 26.6–33.0)
MCHC: 33.3 g/dL (ref 31.5–35.7)
MCV: 86 fL (ref 79–97)
Monocytes Absolute: 0.6 10*3/uL (ref 0.1–0.9)
Monocytes: 10 %
Neutrophils Absolute: 3.7 10*3/uL (ref 1.4–7.0)
Neutrophils: 59 %
Platelets: 205 10*3/uL (ref 150–450)
RBC: 5.65 x10E6/uL (ref 4.14–5.80)
RDW: 12.8 % (ref 11.6–15.4)
WBC: 6.2 10*3/uL (ref 3.4–10.8)

## 2019-12-03 LAB — CMP14+EGFR
ALT: 24 IU/L (ref 0–44)
AST: 26 IU/L (ref 0–40)
Albumin/Globulin Ratio: 2 (ref 1.2–2.2)
Albumin: 4.5 g/dL (ref 3.7–4.7)
Alkaline Phosphatase: 63 IU/L (ref 48–121)
BUN/Creatinine Ratio: 15 (ref 10–24)
BUN: 15 mg/dL (ref 8–27)
Bilirubin Total: 0.3 mg/dL (ref 0.0–1.2)
CO2: 21 mmol/L (ref 20–29)
Calcium: 9.6 mg/dL (ref 8.6–10.2)
Chloride: 106 mmol/L (ref 96–106)
Creatinine, Ser: 1.03 mg/dL (ref 0.76–1.27)
GFR calc Af Amer: 84 mL/min/{1.73_m2} (ref >59)
GFR calc non Af Amer: 73 mL/min/{1.73_m2} (ref >59)
Globulin, Total: 2.3 g/dL (ref 1.5–4.5)
Glucose: 90 mg/dL (ref 65–99)
Potassium: 4.7 mmol/L (ref 3.5–5.2)
Sodium: 141 mmol/L (ref 134–144)
Total Protein: 6.8 g/dL (ref 6.0–8.5)

## 2019-12-03 LAB — LIPID PANEL
Chol/HDL Ratio: 3.7 ratio (ref 0.0–5.0)
Cholesterol, Total: 131 mg/dL (ref 100–199)
HDL: 35 mg/dL — ABNORMAL LOW (ref >39)
LDL Chol Calc (NIH): 75 mg/dL (ref 0–99)
Triglycerides: 115 mg/dL (ref 0–149)
VLDL Cholesterol Cal: 21 mg/dL (ref 5–40)

## 2019-12-03 LAB — PSA TOTAL (REFLEX TO FREE): Prostate Specific Ag, Serum: 1.2 ng/mL (ref 0.0–4.0)

## 2019-12-03 NOTE — Progress Notes (Signed)
Hello Estle,  Your lab result is normal and/or stable.Some minor variations that are not significant are commonly marked abnormal, but do not represent any medical problem for you.  Best regards, Warren Stacks, M.D.

## 2019-12-04 ENCOUNTER — Encounter: Payer: Self-pay | Admitting: Family Medicine

## 2019-12-04 DIAGNOSIS — G609 Hereditary and idiopathic neuropathy, unspecified: Secondary | ICD-10-CM | POA: Insufficient documentation

## 2019-12-04 MED ORDER — MELOXICAM 15 MG PO TABS: 15.0000 mg | ORAL_TABLET | Freq: Every day | ORAL | 0 refills | Status: DC

## 2019-12-08 ENCOUNTER — Encounter: Payer: Self-pay | Admitting: Internal Medicine

## 2020-02-17 ENCOUNTER — Ambulatory Visit: Payer: Medicare Other

## 2020-02-27 ENCOUNTER — Encounter: Payer: Self-pay | Admitting: Family Medicine

## 2020-02-29 ENCOUNTER — Other Ambulatory Visit: Payer: Self-pay | Admitting: Family Medicine

## 2020-02-29 MED ORDER — MELOXICAM 15 MG PO TABS: 15.0000 mg | ORAL_TABLET | Freq: Every day | ORAL | 3 refills | Status: DC

## 2020-03-23 ENCOUNTER — Ambulatory Visit: Payer: Medicare Other

## 2020-04-18 DIAGNOSIS — Z23 Encounter for immunization: Secondary | ICD-10-CM | POA: Diagnosis not present

## 2020-10-25 ENCOUNTER — Encounter: Payer: Self-pay | Admitting: *Deleted

## 2020-12-01 ENCOUNTER — Other Ambulatory Visit: Payer: Self-pay

## 2020-12-01 ENCOUNTER — Ambulatory Visit (INDEPENDENT_AMBULATORY_CARE_PROVIDER_SITE_OTHER): Payer: Medicare Other | Admitting: Family Medicine

## 2020-12-01 ENCOUNTER — Ambulatory Visit (INDEPENDENT_AMBULATORY_CARE_PROVIDER_SITE_OTHER): Payer: Medicare Other

## 2020-12-01 ENCOUNTER — Encounter: Payer: Self-pay | Admitting: Family Medicine

## 2020-12-01 VITALS — BP 137/68 | HR 79 | Temp 98.2°F | Ht 72.5 in | Wt 220.4 lb

## 2020-12-01 DIAGNOSIS — G609 Hereditary and idiopathic neuropathy, unspecified: Secondary | ICD-10-CM

## 2020-12-01 DIAGNOSIS — Z1211 Encounter for screening for malignant neoplasm of colon: Secondary | ICD-10-CM

## 2020-12-01 DIAGNOSIS — I6930 Unspecified sequelae of cerebral infarction: Secondary | ICD-10-CM

## 2020-12-01 DIAGNOSIS — M542 Cervicalgia: Secondary | ICD-10-CM

## 2020-12-01 DIAGNOSIS — E559 Vitamin D deficiency, unspecified: Secondary | ICD-10-CM

## 2020-12-01 DIAGNOSIS — Z125 Encounter for screening for malignant neoplasm of prostate: Secondary | ICD-10-CM

## 2020-12-01 DIAGNOSIS — N429 Disorder of prostate, unspecified: Secondary | ICD-10-CM | POA: Diagnosis not present

## 2020-12-01 DIAGNOSIS — Z1159 Encounter for screening for other viral diseases: Secondary | ICD-10-CM

## 2020-12-01 DIAGNOSIS — I639 Cerebral infarction, unspecified: Secondary | ICD-10-CM

## 2020-12-01 NOTE — Progress Notes (Signed)
Subjective:  Patient ID: Shawn Mccall, male    DOB: 03-Oct-1950  Age: 70 y.o. MRN: [id number]  CC: check up   HPI Shawn Mccall presents for neck pain with clicking when he turns his head. Dul pain. Declines to take medication. Wantts to see chiropractor.   Depression screen [facility] 2/9 12/01/2020 12/02/2019 05/20/2018  Decreased Interest 0 0 0  Down, Depressed, Hopeless 0 0 0  PHQ - 2 Score 0 0 0  Altered sleeping - - -  Tired, decreased energy - - -  Change in appetite - - -  Feeling bad or failure about yourself  - - -  Trouble concentrating - - -  Moving slowly or fidgety/restless - - -  Suicidal thoughts - - -  PHQ-9 Score - - -    History Shawn Mccall has a past medical history of AVM (arteriovenous malformation) and Kidney stones.   He has a past surgical history that includes Back surgery (1998) and Inguinal hernia repair (1970).   His family history is not on file.He reports that he quit smoking about 2 years ago. He smoked 0.50 packs per day. He has never used smokeless tobacco. He reports that he does not drink alcohol. No history on file for drug use.    ROS Review of Systems  Constitutional: Negative.   HENT: Negative.   Eyes: Negative for visual disturbance.  Respiratory: Negative for cough and shortness of breath.   Cardiovascular: Negative for chest pain and leg swelling.  Gastrointestinal: Negative for abdominal pain, diarrhea, nausea and vomiting.  Genitourinary: Negative for difficulty urinating.  Musculoskeletal: Negative for arthralgias and myalgias.  Skin: Negative for rash.  Neurological: Positive for numbness (feet). Negative for headaches.  Psychiatric/Behavioral: Negative for sleep disturbance.    Objective:  BP 137/68   Pulse 79   Temp 98.2 F (36.8 C)   Ht 6' 0.5" (1.842 m)   Wt 220 lb 6.4 oz (100 kg)   SpO2 98%   BMI 29.48 kg/m   BP Readings from Last 3 Encounters:  12/01/20 137/68  12/02/19 136/81  05/20/18 137/70    Wt Readings  from Last 3 Encounters:  12/01/20 220 lb 6.4 oz (100 kg)  12/02/19 231 lb 8 oz (105 kg)  05/20/18 226 lb 12.8 oz (102.9 kg)     Physical Exam Vitals reviewed.  Constitutional:      Appearance: He is well-developed.  HENT:     Head: Normocephalic and atraumatic.     Right Ear: Tympanic membrane and external ear normal. No decreased hearing noted.     Left Ear: Tympanic membrane and external ear normal. No decreased hearing noted.     Mouth/Throat:     Pharynx: No oropharyngeal exudate or posterior oropharyngeal erythema.  Eyes:     Pupils: Pupils are equal, round, and reactive to light.  Cardiovascular:     Rate and Rhythm: Normal rate and regular rhythm.     Heart sounds: No murmur heard.   Pulmonary:     Effort: No respiratory distress.     Breath sounds: Normal breath sounds.  Abdominal:     General: Bowel sounds are normal.     Palpations: Abdomen is soft. There is no mass.     Tenderness: There is no abdominal tenderness.  Musculoskeletal:     Cervical back: Normal range of motion and neck supple.       Assessment & Plan:   Shawn Mccall was seen today for check up.  Diagnoses and  all orders for this visit:  Idiopathic peripheral neuropathy -     CBC with Differential/Platelet -     CMP14+EGFR -     VITAMIN D 25 Hydroxy (Vit-D Deficiency, Fractures) -     Urinalysis -     PSA, total and free -     TSH  Cerebellar stroke ([facility]) -     CBC with Differential/Platelet -     CMP14+EGFR -     VITAMIN D 25 Hydroxy (Vit-D Deficiency, Fractures) -     Urinalysis -     PSA, total and free -     TSH  Neck pain -     Ambulatory referral to Physical Therapy -     DG Cervical Spine Complete; Future  Screening for prostate cancer -     PSA, total and free  Vitamin D deficiency -     VITAMIN D 25 Hydroxy (Vit-D Deficiency, Fractures)  Need for hepatitis C screening test -     Hepatitis C antibody  Disorder of prostate, unspecified  -     PSA, total and  free  Screen for colon cancer -     Ambulatory referral to Gastroenterology       I have discontinued [name]. Alter's Alpha-Lipoic Acid and meloxicam. I am also having him maintain his Multiple Vitamins-Minerals (MULTIVITAMIN ADULT PO), aspirin EC, and Melatonin.  Allergies as of 12/01/2020      Reactions   Contrast Media [iodinated Diagnostic Agents]       Medication List       Accurate as of Dec 01, 2020 11:35 AM. If you have any questions, ask your nurse or doctor.        STOP taking these medications   Alpha-Lipoic Acid 200 MG Caps Stopped by: Claretta Fraise, MD   meloxicam 15 MG tablet Commonly known as: MOBIC Stopped by: Claretta Fraise, MD     TAKE these medications   aspirin EC 81 MG tablet Take 1 tablet (81 mg total) by mouth daily.   Melatonin 10 MG Tabs Take 10 mg by mouth at bedtime.   MULTIVITAMIN ADULT PO Take 1 tablet by mouth daily.        Follow-up: No follow-ups on file.  Claretta Fraise, M.D.

## 2020-12-02 ENCOUNTER — Other Ambulatory Visit: Payer: Medicare Other

## 2020-12-02 DIAGNOSIS — Z125 Encounter for screening for malignant neoplasm of prostate: Secondary | ICD-10-CM | POA: Diagnosis not present

## 2020-12-02 DIAGNOSIS — I639 Cerebral infarction, unspecified: Secondary | ICD-10-CM | POA: Diagnosis not present

## 2020-12-02 DIAGNOSIS — E559 Vitamin D deficiency, unspecified: Secondary | ICD-10-CM | POA: Diagnosis not present

## 2020-12-02 DIAGNOSIS — G609 Hereditary and idiopathic neuropathy, unspecified: Secondary | ICD-10-CM | POA: Diagnosis not present

## 2020-12-02 DIAGNOSIS — N429 Disorder of prostate, unspecified: Secondary | ICD-10-CM | POA: Diagnosis not present

## 2020-12-02 DIAGNOSIS — Z1159 Encounter for screening for other viral diseases: Secondary | ICD-10-CM | POA: Diagnosis not present

## 2020-12-03 LAB — PSA, TOTAL AND FREE
PSA, Free Pct: 30 %
PSA, Free: 0.42 ng/mL
Prostate Specific Ag, Serum: 1.4 ng/mL (ref 0.0–4.0)

## 2020-12-03 LAB — CBC WITH DIFFERENTIAL/PLATELET
Basophils Absolute: 0 10*3/uL (ref 0.0–0.2)
Basos: 1 %
EOS (ABSOLUTE): 0.1 10*3/uL (ref 0.0–0.4)
Eos: 2 %
Hematocrit: 47.2 % (ref 37.5–51.0)
Hemoglobin: 16 g/dL (ref 13.0–17.7)
Immature Grans (Abs): 0 10*3/uL (ref 0.0–0.1)
Immature Granulocytes: 0 %
Lymphocytes Absolute: 1.6 10*3/uL (ref 0.7–3.1)
Lymphs: 29 %
MCH: 28.2 pg (ref 26.6–33.0)
MCHC: 33.9 g/dL (ref 31.5–35.7)
MCV: 83 fL (ref 79–97)
Monocytes Absolute: 0.6 10*3/uL (ref 0.1–0.9)
Monocytes: 11 %
Neutrophils Absolute: 3.1 10*3/uL (ref 1.4–7.0)
Neutrophils: 57 %
Platelets: 201 10*3/uL (ref 150–450)
RBC: 5.68 x10E6/uL (ref 4.14–5.80)
RDW: 12 % (ref 11.6–15.4)
WBC: 5.4 10*3/uL (ref 3.4–10.8)

## 2020-12-03 LAB — CMP14+EGFR
ALT: 16 IU/L (ref 0–44)
AST: 20 IU/L (ref 0–40)
Albumin/Globulin Ratio: 2 (ref 1.2–2.2)
Albumin: 4.2 g/dL (ref 3.7–4.7)
Alkaline Phosphatase: 68 IU/L (ref 44–121)
BUN/Creatinine Ratio: 13 (ref 10–24)
BUN: 13 mg/dL (ref 8–27)
Bilirubin Total: 0.5 mg/dL (ref 0.0–1.2)
CO2: 23 mmol/L (ref 20–29)
Calcium: 9.5 mg/dL (ref 8.6–10.2)
Chloride: 105 mmol/L (ref 96–106)
Creatinine, Ser: 1 mg/dL (ref 0.76–1.27)
Globulin, Total: 2.1 g/dL (ref 1.5–4.5)
Glucose: 95 mg/dL (ref 65–99)
Potassium: 4.4 mmol/L (ref 3.5–5.2)
Sodium: 142 mmol/L (ref 134–144)
Total Protein: 6.3 g/dL (ref 6.0–8.5)
eGFR: 80 mL/min/{1.73_m2} (ref >59)

## 2020-12-03 LAB — TSH: TSH: 2.51 u[IU]/mL (ref 0.450–4.500)

## 2020-12-03 LAB — VITAMIN D 25 HYDROXY (VIT D DEFICIENCY, FRACTURES): Vit D, 25-Hydroxy: 39.4 ng/mL (ref 30.0–100.0)

## 2020-12-03 LAB — HEPATITIS C ANTIBODY: Hep C Virus Ab: <0.1 s/co ratio (ref 0.0–0.9)

## 2020-12-05 ENCOUNTER — Other Ambulatory Visit: Payer: Self-pay | Admitting: Family Medicine

## 2020-12-05 DIAGNOSIS — M5412 Radiculopathy, cervical region: Secondary | ICD-10-CM

## 2020-12-05 NOTE — Progress Notes (Signed)
Hello Eino,  Your lab result is normal and/or stable.Some minor variations that are not significant are commonly marked abnormal, but do not represent any medical problem for you.  Best regards, Noralee Dutko, M.D.

## 2020-12-06 ENCOUNTER — Ambulatory Visit: Payer: Medicare Other | Admitting: Family Medicine

## 2020-12-12 ENCOUNTER — Other Ambulatory Visit: Payer: Self-pay

## 2020-12-12 ENCOUNTER — Ambulatory Visit (HOSPITAL_COMMUNITY)
Admission: RE | Admit: 2020-12-12 | Discharge: 2020-12-12 | Disposition: A | Discharge status: Home / Self Care | Payer: Medicare Other | Source: Ambulatory Visit | Attending: Family Medicine | Admitting: Family Medicine

## 2020-12-12 DIAGNOSIS — M542 Cervicalgia: Secondary | ICD-10-CM | POA: Diagnosis not present

## 2020-12-12 DIAGNOSIS — M5412 Radiculopathy, cervical region: Secondary | ICD-10-CM | POA: Insufficient documentation

## 2020-12-13 ENCOUNTER — Ambulatory Visit: Payer: Medicare Other

## 2020-12-14 ENCOUNTER — Encounter: Payer: Self-pay | Admitting: Family Medicine

## 2020-12-14 DIAGNOSIS — M5412 Radiculopathy, cervical region: Secondary | ICD-10-CM

## 2020-12-15 ENCOUNTER — Other Ambulatory Visit: Payer: Self-pay | Admitting: Family Medicine

## 2020-12-15 DIAGNOSIS — M5412 Radiculopathy, cervical region: Secondary | ICD-10-CM

## 2020-12-16 ENCOUNTER — Encounter: Payer: Self-pay | Admitting: *Deleted

## 2020-12-24 ENCOUNTER — Other Ambulatory Visit: Payer: Self-pay | Admitting: Family Medicine

## 2020-12-24 DIAGNOSIS — M542 Cervicalgia: Secondary | ICD-10-CM

## 2020-12-24 DIAGNOSIS — M5412 Radiculopathy, cervical region: Secondary | ICD-10-CM

## 2021-01-02 DIAGNOSIS — R030 Elevated blood-pressure reading, without diagnosis of hypertension: Secondary | ICD-10-CM | POA: Diagnosis not present

## 2021-01-02 DIAGNOSIS — M4802 Spinal stenosis, cervical region: Secondary | ICD-10-CM | POA: Diagnosis not present

## 2021-01-02 DIAGNOSIS — Z6828 Body mass index (BMI) 28.0-28.9, adult: Secondary | ICD-10-CM | POA: Diagnosis not present

## 2021-01-02 DIAGNOSIS — M47812 Spondylosis without myelopathy or radiculopathy, cervical region: Secondary | ICD-10-CM | POA: Diagnosis not present

## 2021-01-10 ENCOUNTER — Other Ambulatory Visit: Payer: Self-pay

## 2021-01-10 ENCOUNTER — Ambulatory Visit: Payer: Medicare Other | Attending: Family Medicine

## 2021-01-10 DIAGNOSIS — R293 Abnormal posture: Secondary | ICD-10-CM | POA: Insufficient documentation

## 2021-01-10 DIAGNOSIS — M542 Cervicalgia: Secondary | ICD-10-CM | POA: Diagnosis not present

## 2021-01-11 NOTE — Therapy (Signed)
[facility] [place], [place], [address] Phone: [phone]   Fax:  [phone]  Physical Therapy Evaluation  Patient Details  Name: Shawn Mccall MRN: [id number] Date of Birth: February 07, 1951 Referring Provider (PT): Shawn Mccall   Encounter Date: 01/10/2021   PT End of Session - 01/10/21 1445     Visit Number 1    Number of Visits 8    Date for PT Re-Evaluation 02/07/21    PT Start Time 1350    PT Stop Time 1445    PT Time Calculation (min) 55 min    Activity Tolerance Patient tolerated treatment well    Behavior During Therapy [facility] for tasks assessed/performed             Past Medical History:  Diagnosis Date   AVM (arteriovenous malformation)    Kidney stones     Past Surgical History:  Procedure Laterality Date   BACK SURGERY  1998   L5-S1 partial disectomy    INGUINAL HERNIA REPAIR  1970    There were no vitals filed for this visit.    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 One place is more painful at the  L vs R ats he points to the base of his skull. He wants to be able to continue to play his guitar and wants to be able to continue playing sports of any kind without limitations in the neck nor back pain. He continues to have a lightheadedness, denies dizziness or change in vision,  no difficulty with breathing, swallowing, fever, chills or night sweats. Patient denies recent falls or unsteadiness of gait. He reports an ongoing back pain and a peripheral neuropathy he wishese to havr addressed wit hPT as well.    Patient Stated Goals TO have less no pain with head turns    Currently in Pain? Yes    Pain Score 2     Pain Location Neck    Pain Orientation Upper    Pain Type Chronic pain    Pain Onset More than a month ago    Aggravating Factors  tunrning left and right  to drive a tractor    Multiple Pain Sites Yes    Pain Score 6    Pain Location Back    Pain Orientation Lower                [facility] PT Assessment - 01/10/21 1400       Assessment   Medical Diagnosis Cervicalgia    Referring Provider (PT) Shawn Mccall    Prior Therapy No      Prior Function   Level of Independence Independent      Sensation   Light Touch Appears Intact      ROM / Strength   AROM / PROM / Strength AROM;Strength      AROM   AROM Assessment Site Cervical    Cervical - Right Rotation 75    Cervical - Left Rotation 55      Strength   Strength Assessment Site Shoulder    Right/Left Shoulder Right;Left    Right Shoulder Flexion 4-/5    Left Shoulder Flexion 4-/5      Palpation   Palpation comment TTP at C5-C7  ; palpable cyst like mass at ase of skull - no tenderness )  Special Tests    Special Tests Cervical    Cervical Tests Dictraction;other      Distraction Test   Findngs Negative      other    Findings Negative    Comment Sharps Purser                        Objective measurements completed on examination: See above findings.       [place] Adult PT Treatment/Exercise - 01/10/21 1415       Manual Therapy   Manual Therapy Joint mobilization    Manual therapy comments No change in cervical rotation at end of session, reduced palapable cyst (patient reports that the cyst often comes and goes but is no bother to him)    Joint Mobilization imprved fmobility at the C7/C6 soft tissue - limited change in ROM of the cervical spine                      PT Short Term Goals - 01/10/21 1500       PT SHORT TERM GOAL #1   Title Indep with initial HEP    Baseline lacks knowledge of home exercises    Time 1    Period Weeks    Status New    Target Date 01/17/21               PT Long Term Goals - 01/10/21 1445       PT LONG TERM GOAL #1   Title Advance to indep with advanced HEP    Time 4    Period  Weeks    Status New    Target Date 02/07/21      PT LONG TERM GOAL #2   Title Increase active cervical rotation to 70 degrees+ so patient can turn head more easily while driving.    Time 4    Period Weeks    Status New    Target Date 02/07/21      PT LONG TERM GOAL #3   Title Decrease symptoms with ADLS less than 5/57    Baseline grater than 6/10 at times    Time 4    Period Weeks    Status New    Target Date 02/07/21                    Plan - 01/10/21 1500     Clinical Impression Statement Shawn Mccall is a pleasnat 70 yo male referred to OPPT with compliants of L sided neck pain with limitations performing cervical rotation. He also reports limitations with lumbar pain and radicular symptoms plus neuropathy - not yet assesed but to be requested as additional site for current episode of care. Plan of care for current episode encourages postural strength and endurance with improving cericothoracic mobility to increase ease of looking over shoulder to drive and participate in sports/leisure activities.    Personal Factors and Comorbidities Other    Examination-Activity Limitations Other    Examination-Participation Restrictions Other    Stability/Clinical Decision Making Stable/Uncomplicated    Clinical Decision Making Low    Rehab Potential Excellent    PT Frequency 2x / week    PT Duration 4 weeks    PT Treatment/Interventions Moist Heat;Therapeutic exercise;Therapeutic activities;Neuromuscular re-education;Manual techniques;Vasopneumatic Device;Joint Manipulations    PT Next Visit Plan cervico(cx)thoracic mobility, scapular strength and mobility, cx iso    Consulted and Agree with Plan of Care Patient  Patient will benefit from skilled therapeutic intervention in order to improve the following deficits and impairments:  Decreased endurance, Postural dysfunction, Pain, Decreased strength, Decreased range of motion  Visit  Diagnosis: Cervicalgia  Abnormal posture     Problem List Patient Active Problem List   Diagnosis Date Noted   Idiopathic peripheral neuropathy 12/04/2019   Imbalance 05/10/2015   Cavernous malformation 05/10/2015   Cerebellar stroke ([place]) 05/10/2015   Vertigo 05/10/2015    Shawn Mccall 01/11/2021, 12:58 PM  Shawn Mccall [address] [place], [place], [address] Phone: [phone]   Fax:  [phone]  Name: Shawn Mccall MRN: [id number] Date of Birth: Nov 10, 1950

## 2021-01-12 ENCOUNTER — Ambulatory Visit: Payer: Medicare Other

## 2021-01-12 ENCOUNTER — Other Ambulatory Visit: Payer: Self-pay

## 2021-01-12 DIAGNOSIS — R293 Abnormal posture: Secondary | ICD-10-CM

## 2021-01-12 DIAGNOSIS — M542 Cervicalgia: Secondary | ICD-10-CM

## 2021-01-12 NOTE — Therapy (Signed)
[facility] [place], [place], [address] Phone: [phone]   Fax:  [phone]  Physical Therapy Treatment  Patient Details  Name: Shawn Mccall MRN: [id number] Date of Birth: [birth year] Referring Provider (PT): Claretta Fraise   Encounter Date: 01/12/2021   PT End of Session - 01/12/21 1030     Visit Number 2    Number of Visits 8    Date for PT Re-Evaluation 02/07/21    PT Start Time 0900    PT Stop Time 0947    PT Time Calculation (min) 47 min    Activity Tolerance Patient tolerated treatment well    Behavior During Therapy [facility] for tasks assessed/performed             Past Medical History:  Diagnosis Date   AVM (arteriovenous malformation)    Kidney stones     Past Surgical History:  Procedure Laterality Date   BACK SURGERY  1998   L5-S1 partial disectomy    INGUINAL HERNIA REPAIR  1970    There were no vitals filed for this visit.   Subjective Assessment - 01/12/21 0914     Subjective COVID-19 screening performed upon arrival. Patient reports that he has had minimal neck pain nut still concerned about low back and the neuropathy. He reports indep with HEP and compliance. He demonstrates several additional exercises that he does at home as well.    Pain Onset More than a month ago                               [facility] Adult PT Treatment/Exercise - 01/12/21 0900       Exercises   Exercises Neck      Neck Exercises: Machines for Strengthening   UBE (Upper Arm Bike) 8 mins fwdretro x 1 min each direction      Neck Exercises: Standing   Other Standing Exercises Standing PPT at the wall with UE elevation / VCs to refrain from deep squat)    Other Standing Exercises standing open books at the wall - pt indep with recall and performance      Neck Exercises: Seated   Other Seated Exercise LS stretch bi gr II    Other Seated Exercise UT stretch bil gr II      Neck Exercises: Supine    Cervical Isometrics Flexion;Left rotation;Right rotation;3 secs;10 reps    Neck Retraction 10 reps    Other Supine Exercise horizontal abduction with red tband and OH reach; mild chin tuck at end range - side bending with head lifts off of the pilld      Manual Therapy   Manual Therapy Joint mobilization;Soft tissue mobilization    Manual therapy comments minial changes noted (attained 60 deg rotation to tje L)    Joint Mobilization C6- C7 inferior/lateral mob gr II. LL      Neck Exercises: Stretches   Upper Trapezius Stretch 3 reps;20 seconds    Levator Stretch 3 reps;20 seconds                      PT Short Term Goals - 01/10/21 1500       PT SHORT TERM GOAL #1   Title Indep with initial HEP    Baseline lacks knowledge of home exercises    Time 1    Period Weeks    Status New    Target Date 01/17/21  PT Long Term Goals - 01/10/21 1445       PT LONG TERM GOAL #1   Title Advance to indep with advanced HEP    Time 4    Period Weeks    Status New    Target Date 02/07/21      PT LONG TERM GOAL #2   Title Increase active cervical rotation to 70 degrees+ so patient can turn head more easily while driving.    Time 4    Period Weeks    Status New    Target Date 02/07/21      PT LONG TERM GOAL #3   Title Decrease symptoms with ADLS less than 7/86    Baseline grater than 6/10 at times    Time 4    Period Weeks    Status New    Target Date 02/07/21                   Plan - 01/12/21 1157     Clinical Impression Statement Patient demonstrates that heis indep with his initial HEP without limitations due to pain. He is appropriate to progress with cervicothoracic mobility and strenght to improve initial complaints of limited mobility when looking over shoulder to drive. Further assessment of other concerns to be addressed acocrding to MD referral. Skilled PT recommended to address limited cervical mobility.    Personal Factors and  Comorbidities Other    Examination-Activity Limitations Other    Examination-Participation Restrictions Other    Stability/Clinical Decision Making Stable/Uncomplicated    Clinical Decision Making Low    Rehab Potential Excellent    PT Frequency 2x / week    PT Duration 4 weeks    PT Treatment/Interventions Moist Heat;Therapeutic exercise;Therapeutic activities;Neuromuscular re-education;Manual techniques;Vasopneumatic Device;Joint Manipulations    PT Next Visit Plan cervico(cx)thoracic mobility, scapular strength and mobility, cx iso    Consulted and Agree with Plan of Care Patient             Patient will benefit from skilled therapeutic intervention in order to improve the following deficits and impairments:  Decreased endurance, Postural dysfunction, Pain, Decreased strength, Decreased range of motion  Visit Diagnosis: Cervicalgia  Abnormal posture     Problem List Patient Active Problem List   Diagnosis Date Noted   Idiopathic peripheral neuropathy 12/04/2019   Imbalance 05/10/2015   Cavernous malformation 05/10/2015   Cerebellar stroke ([place]) 05/10/2015   Vertigo 05/10/2015    Marylou Mccoy PT, DPT 01/12/2021, 12:02 PM  [facility] [place], [place], [address] Phone: [phone]   Fax:  [phone]  Name: Shawn Mccall MRN: [id number] Date of Birth: 12-04-1950

## 2021-01-17 ENCOUNTER — Encounter: Payer: Self-pay | Admitting: Physical Therapy

## 2021-01-17 ENCOUNTER — Other Ambulatory Visit: Payer: Self-pay

## 2021-01-17 ENCOUNTER — Ambulatory Visit: Payer: Medicare Other | Admitting: Physical Therapy

## 2021-01-17 ENCOUNTER — Ambulatory Visit: Payer: Medicare Other

## 2021-01-17 DIAGNOSIS — M542 Cervicalgia: Secondary | ICD-10-CM

## 2021-01-17 DIAGNOSIS — R293 Abnormal posture: Secondary | ICD-10-CM | POA: Diagnosis not present

## 2021-01-17 NOTE — Therapy (Signed)
[facility] [place], [place], [address] Phone: [phone]   Fax:  [phone]  Physical Therapy Treatment  Patient Details  Name: Shawn Mccall MRN: [id number] Date of Birth: 1951/07/05 Referring Provider (PT): Claretta Fraise   Encounter Date: 01/17/2021   PT End of Session - 01/17/21 0826     Visit Number 3    Number of Visits 8    Date for PT Re-Evaluation 02/07/21    PT Start Time 0818    PT Stop Time 0900    PT Time Calculation (min) 42 min    Activity Tolerance Patient tolerated treatment well    Behavior During Therapy [facility] for tasks assessed/performed             Past Medical History:  Diagnosis Date   AVM (arteriovenous malformation)    Kidney stones     Past Surgical History:  Procedure Laterality Date   BACK SURGERY  1998   L5-S1 partial disectomy    INGUINAL HERNIA REPAIR  1970    There were no vitals filed for this visit.   Subjective Assessment - 01/17/21 0824     Subjective COVID-19 screening performed upon arrival. Reports still having some pain in L cervical with rotation to the R.    Patient Stated Goals TO have less no pain with head turns    Currently in Pain? Other (Comment)   "Its much better."               [facility] PT Assessment - 01/17/21 0001       Assessment   Medical Diagnosis Cervicalgia    Referring Provider (PT) Claretta Fraise    Next MD Visit None    Prior Therapy No                           [facility] Adult PT Treatment/Exercise - 01/17/21 0001       Exercises   Exercises Neck;Lumbar      Neck Exercises: Machines for Strengthening   UBE (Upper Arm Bike) 120 RPM x8 min (forward/backward)      Lumbar Exercises: Supine   Bent Knee Raise 20 reps;Limitations    Bent Knee Raise Limitations 90/90    Dead Bug 20 reps;5 seconds;Limitations    Dead Bug Limitations with ball; 90/90 dead bug x15 reps    Bridge 20 reps;3 seconds    Bridge with March  20 reps    Other Supine Lumbar Exercises Bridge with DL on PB x20 reps      Lumbar Exercises: Quadruped   [place] Horse 10 reps    Single Arm Raise Right;Left;10 reps    Straight Leg Raise 10 reps    Opposite Arm/Leg Raise Right arm/Left leg;Left arm/Right leg;10 reps    Other Quadruped Lumbar Exercises B fire hydrant x15 reps                    PT Education - 01/17/21 0857     Education Details [id number]    Person(s) Educated Patient    Methods Explanation;Handout    Comprehension Verbalized understanding              PT Short Term Goals - 01/10/21 1500       PT SHORT TERM GOAL #1   Title Indep with initial HEP    Baseline lacks knowledge of home exercises    Time 1    Period Weeks  Status New    Target Date 01/17/21               PT Long Term Goals - 01/10/21 1445       PT LONG TERM GOAL #1   Title Advance to indep with advanced HEP    Time 4    Period Weeks    Status New    Target Date 02/07/21      PT LONG TERM GOAL #2   Title Increase active cervical rotation to 70 degrees+ so patient can turn head more easily while driving.    Time 4    Period Weeks    Status New    Target Date 02/07/21      PT LONG TERM GOAL #3   Title Decrease symptoms with ADLS less than 1/74    Baseline grater than 6/10 at times    Time 4    Period Weeks    Status New    Target Date 02/07/21                   Plan - 01/17/21 9449     Clinical Impression Statement Patient presented in clinic with reports of much improvement regarding cervicothoracic symptoms. PCP did sign off for lumbar and neuropathy treatment which was lightly addressed during last appointment with Billy Coast, DPT. Primary focus today was of advanced core strengthening as patient is active in home enviroment. Patient provided new HEP to assist with advancing strength. No complaints of any increased pain following end of session.    Personal Factors and Comorbidities Other     Examination-Activity Limitations Other    Examination-Participation Restrictions Other    Stability/Clinical Decision Making Stable/Uncomplicated    Rehab Potential Excellent    PT Frequency 2x / week    PT Duration 4 weeks    PT Treatment/Interventions Moist Heat;Therapeutic exercise;Therapeutic activities;Neuromuscular re-education;Manual techniques;Vasopneumatic Device;Joint Manipulations    PT Next Visit Plan cervico(cx)thoracic mobility, scapular strength and mobility, cx iso    Consulted and Agree with Plan of Care Patient             Patient will benefit from skilled therapeutic intervention in order to improve the following deficits and impairments:  Decreased endurance, Postural dysfunction, Pain, Decreased strength, Decreased range of motion  Visit Diagnosis: Cervicalgia  Abnormal posture     Problem List Patient Active Problem List   Diagnosis Date Noted   Idiopathic peripheral neuropathy 12/04/2019   Imbalance 05/10/2015   Cavernous malformation 05/10/2015   Cerebellar stroke ([place]) 05/10/2015   Vertigo 05/10/2015    Standley Brooking, PTA 01/17/2021, 9:39 AM  [facility] [place], [place], [address] Phone: [phone]   Fax:  [phone]  Name: THATCHER DOBERSTEIN MRN: [id number] Date of Birth: December 15, 1950

## 2021-05-04 DIAGNOSIS — Z23 Encounter for immunization: Secondary | ICD-10-CM | POA: Diagnosis not present

## 2021-05-29 ENCOUNTER — Ambulatory Visit (INDEPENDENT_AMBULATORY_CARE_PROVIDER_SITE_OTHER): Payer: Medicare Other

## 2021-05-29 VITALS — Ht 73.0 in | Wt 210.0 lb

## 2021-05-29 DIAGNOSIS — Z1211 Encounter for screening for malignant neoplasm of colon: Secondary | ICD-10-CM | POA: Diagnosis not present

## 2021-05-29 DIAGNOSIS — Z0000 Encounter for general adult medical examination without abnormal findings: Secondary | ICD-10-CM

## 2021-05-29 NOTE — Patient Instructions (Signed)
Mr. Shawn Mccall , Thank you for taking time to come for your Medicare Wellness Visit. I appreciate your ongoing commitment to your health goals. Please review the following plan we discussed and let me know if I can assist you in the future.   Screening recommendations/referrals: Colonoscopy: Ordered Cologuard today Recommended yearly ophthalmology/optometry visit for glaucoma screening and checkup Recommended yearly dental visit for hygiene and checkup  Vaccinations: Influenza vaccine: Declined Pneumococcal vaccine: Declined Tdap vaccine: Declined Shingles vaccine: Declined   Covid-19: Done 09/04/2019, 09/29/2019, 04/18/2020, & 05/04/2021  Advanced directives: Advance directive discussed with you today. Even though you declined this today, please call our office should you change your mind, and we can give you the proper paperwork for you to fill out.   Conditions/risks identified: Aim for 30 minutes of exercise or brisk walking each day, drink 6-8 glasses of water and eat lots of fruits and vegetables.   Next appointment: Follow up in one year for your annual wellness visit.   Preventive Care 63 Years and Older, Male  Preventive care refers to lifestyle choices and visits with your health care provider that can promote health and wellness. What does preventive care include? A yearly physical exam. This is also called an annual well check. Dental exams once or twice a year. Routine eye exams. Ask your health care provider how often you should have your eyes checked. Personal lifestyle choices, including: Daily care of your teeth and gums. Regular physical activity. Eating a healthy diet. Avoiding tobacco and drug use. Limiting alcohol use. Practicing safe sex. Taking low doses of aspirin every day. Taking vitamin and mineral supplements as recommended by your health care provider. What happens during an annual well check? The services and screenings done by your health care provider  during your annual well check will depend on your age, overall health, lifestyle risk factors, and family history of disease. Counseling  Your health care provider may ask you questions about your: Alcohol use. Tobacco use. Drug use. Emotional well-being. Home and relationship well-being. Sexual activity. Eating habits. History of falls. Memory and ability to understand (cognition). Work and work Statistician. Screening  You may have the following tests or measurements: Height, weight, and BMI. Blood pressure. Lipid and cholesterol levels. These may be checked every 5 years, or more frequently if you are over 72 years old. Skin check. Lung cancer screening. You may have this screening every year starting at age 25 if you have a 30-pack-year history of smoking and currently smoke or have quit within the past 15 years. Fecal occult blood test (FOBT) of the stool. You may have this test every year starting at age 72. Flexible sigmoidoscopy or colonoscopy. You may have a sigmoidoscopy every 5 years or a colonoscopy every 10 years starting at age 72. Prostate cancer screening. Recommendations will vary depending on your family history and other risks. Hepatitis C blood test. Hepatitis B blood test. Sexually transmitted disease (STD) testing. Diabetes screening. This is done by checking your blood sugar (glucose) after you have not eaten for a while (fasting). You may have this done every 1-3 years. Abdominal aortic aneurysm (AAA) screening. You may need this if you are a current or former smoker. Osteoporosis. You may be screened starting at age 84 if you are at high risk. Talk with your health care provider about your test results, treatment options, and if necessary, the need for more tests. Vaccines  Your health care provider may recommend certain vaccines, such as: Influenza vaccine. This  is recommended every year. Tetanus, diphtheria, and acellular pertussis (Tdap, Td) vaccine. You  may need a Td booster every 10 years. Zoster vaccine. You may need this after age 72. Pneumococcal 13-valent conjugate (PCV13) vaccine. One dose is recommended after age 72. Pneumococcal polysaccharide (PPSV23) vaccine. One dose is recommended after age 72. Talk to your health care provider about which screenings and vaccines you need and how often you need them. This information is not intended to replace advice given to you by your health care provider. Make sure you discuss any questions you have with your health care provider. Document Released: 07/22/2015 Document Revised: 03/14/2016 Document Reviewed: 04/26/2015 Elsevier Interactive Patient Education  2017 [employer] Prevention in the Home Falls can cause injuries. They can happen to people of all ages. There are many things you can do to make your home safe and to help prevent falls. What can I do on the outside of my home? Regularly fix the edges of walkways and driveways and fix any cracks. Remove anything that might make you trip as you walk through a door, such as a raised step or threshold. Trim any bushes or trees on the path to your home. Use bright outdoor lighting. Clear any walking paths of anything that might make someone trip, such as rocks or tools. Regularly check to see if handrails are loose or broken. Make sure that both sides of any steps have handrails. Any raised decks and porches should have guardrails on the edges. Have any leaves, snow, or ice cleared regularly. Use sand or salt on walking paths during winter. Clean up any spills in your garage right away. This includes oil or grease spills. What can I do in the bathroom? Use night lights. Install grab bars by the toilet and in the tub and shower. Do not use towel bars as grab bars. Use non-skid mats or decals in the tub or shower. If you need to sit down in the shower, use a plastic, non-slip stool. Keep the floor dry. Clean up any water that spills  on the floor as soon as it happens. Remove soap buildup in the tub or shower regularly. Attach bath mats securely with double-sided non-slip rug tape. Do not have throw rugs and other things on the floor that can make you trip. What can I do in the bedroom? Use night lights. Make sure that you have a light by your bed that is easy to reach. Do not use any sheets or blankets that are too big for your bed. They should not hang down onto the floor. Have a firm chair that has side arms. You can use this for support while you get dressed. Do not have throw rugs and other things on the floor that can make you trip. What can I do in the kitchen? Clean up any spills right away. Avoid walking on wet floors. Keep items that you use a lot in easy-to-reach places. If you need to reach something above you, use a strong step stool that has a grab bar. Keep electrical cords out of the way. Do not use floor polish or wax that makes floors slippery. If you must use wax, use non-skid floor wax. Do not have throw rugs and other things on the floor that can make you trip. What can I do with my stairs? Do not leave any items on the stairs. Make sure that there are handrails on both sides of the stairs and use them. Fix handrails that  are broken or loose. Make sure that handrails are as long as the stairways. Check any carpeting to make sure that it is firmly attached to the stairs. Fix any carpet that is loose or worn. Avoid having throw rugs at the top or bottom of the stairs. If you do have throw rugs, attach them to the floor with carpet tape. Make sure that you have a light switch at the top of the stairs and the bottom of the stairs. If you do not have them, ask someone to add them for you. What else can I do to help prevent falls? Wear shoes that: Do not have high heels. Have rubber bottoms. Are comfortable and fit you well. Are closed at the toe. Do not wear sandals. If you use a stepladder: Make  sure that it is fully opened. Do not climb a closed stepladder. Make sure that both sides of the stepladder are locked into place. Ask someone to hold it for you, if possible. Clearly mark and make sure that you can see: Any grab bars or handrails. First and last steps. Where the edge of each step is. Use tools that help you move around (mobility aids) if they are needed. These include: Canes. Walkers. Scooters. Crutches. Turn on the lights when you go into a dark area. Replace any light bulbs as soon as they burn out. Set up your furniture so you have a clear path. Avoid moving your furniture around. If any of your floors are uneven, fix them. If there are any pets around you, be aware of where they are. Review your medicines with your doctor. Some medicines can make you feel dizzy. This can increase your chance of falling. Ask your doctor what other things that you can do to help prevent falls. This information is not intended to replace advice given to you by your health care provider. Make sure you discuss any questions you have with your health care provider. Document Released: 04/21/2009 Document Revised: 12/01/2015 Document Reviewed: 07/30/2014 Elsevier Interactive Patient Education  2017 [employer].

## 2021-05-29 NOTE — Progress Notes (Signed)
Subjective:   Shawn Mccall is a 70 y.o. male who presents for an Initial Medicare Annual Wellness Visit.  Virtual Visit via Telephone Note  I connected with  Shawn Mccall on 05/29/21 at  8:15 AM EST by telephone and verified that I am speaking with the correct person using two identifiers.  Location: Patient: Home Provider: [facility] Persons participating in the virtual visit: patient/Nurse Health Advisor   I discussed the limitations, risks, security and privacy concerns of performing an evaluation and management service by telephone and the availability of in person appointments. The patient expressed understanding and agreed to proceed.  Interactive audio and video telecommunications were attempted between this nurse and patient, however failed, due to patient having technical difficulties OR patient did not have access to video capability.  We continued and completed visit with audio only.  Some vital signs may be absent or patient reported.   Shawn Coover E [place] Masaki, LPN   Review of Systems     Cardiac Risk Factors include: advanced age (>12men, >53 women);Other (see comment) (hx of cerebellar stroke), Risk factor comments: hx of cerebellar stroke     Objective:    Today's Vitals   05/29/21 0819  Weight: 210 lb (95.3 kg)  Height: 6\' 1"  (1.854 m)   Body mass index is 27.71 kg/m.  Advanced Directives 05/29/2021 05/05/2015  Does Patient Have a Medical Advance Directive? No No  Would patient like information on creating a medical advance directive? No - Patient declined No - patient declined information    Current Medications (verified) Outpatient Encounter Medications as of 05/29/2021  Medication Sig   aspirin EC 81 MG tablet Take 1 tablet (81 mg total) by mouth daily.   Multiple Vitamins-Minerals (MULTIVITAMIN ADULT PO) Take 1 tablet by mouth daily.   omega-3 acid ethyl esters (LOVAZA) 1 g capsule Take by mouth 2 (two) times daily.   Melatonin 10 MG TABS Take 10 mg by mouth  at bedtime. (Patient not taking: Reported on 05/29/2021)   No facility-administered encounter medications on file as of 05/29/2021.    Allergies (verified) Contrast media [iodinated diagnostic agents]   History: Past Medical History:  Diagnosis Date   Allergy 1960's   hay fever - mild   AVM (arteriovenous malformation)    Kidney stones    Stroke [facility]) diagnosed 2016 at [facility]   emergency room visit - mri/cat scan detected it   Past Surgical History:  Procedure Laterality Date   BACK SURGERY  07/09/1996   L5-S1 partial disectomy    INGUINAL HERNIA REPAIR  07/09/1968   SPINE SURGERY  removal detached disc segment mid 90's   not sure this qualifies as "spine" L5/S1 herniated disc   Family History  Problem Relation Age of Onset   Cancer Mother    Cancer Father    Neuropathy Neg Hx    Stroke Neg Hx    Social History   Socioeconomic History   Marital status: Single    Spouse name: Not on file   Number of children: 0   Years of education: Not on file   Highest education level: Not on file  Occupational History   Occupation: Retired  Tobacco Use   Smoking status: Former    Types: E-cigarettes   Smokeless tobacco: Never   Tobacco comments:    Environmental consultant. since high school  Vaping Use   Vaping Use: Some days  Substance and Sexual Activity   Alcohol use: Yes    Comment: social drinker,  light   Drug use: Never   Sexual activity: Not Currently    Birth control/protection: None  Other Topics Concern   Not on file  Social History Narrative   Lives at home by himself.   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   Gym/work out twice a week   Caffeine use: 4 cups per day   Social Determinants of Radio broadcast assistant Strain: Low Risk    Difficulty of Paying Living Expenses: Not very hard  Food Insecurity: No Food Insecurity   Worried About Charity fundraiser in the Last Year: Never true   Arboriculturist in the Last Year: Never  true  Transportation Needs: No Transportation Needs   Lack of Transportation (Medical): No   Lack of Transportation (Non-Medical): No  Physical Activity: Sufficiently Active   Days of Exercise per Week: 5 days   Minutes of Exercise per Session: 60 min  Stress: No Stress Concern Present   Feeling of Stress : Not at all  Social Connections: Moderately Isolated   Frequency of Communication with Friends and Family: Twice a week   Frequency of Social Gatherings with Friends and Family: Twice a week   Attends Religious Services: Never   Marine scientist or Organizations: Yes   Attends Music therapist: 1 to 4 times per year   Marital Status: Divorced    Tobacco Counseling Counseling given: Not Answered Tobacco comments: quit/start/quit/start etc. since high school   Clinical Intake:  Pre-visit preparation completed: Yes  Pain : No/denies pain     BMI - recorded: 27.71 Nutritional Status: BMI 25 -29 Overweight Nutritional Risks: None Diabetes: No  How often do you need to have someone help you when you read instructions, pamphlets, or other written materials from your doctor or pharmacy?: 1 - Never  Diabetic? no  Interpreter Needed?: No  Information entered by :: Lacey Dotson, LPN   Activities of Daily Living In your present state of health, do you have any difficulty performing the following activities: 05/29/2021 05/29/2021  Hearing? N N  Vision? N N  Difficulty concentrating or making decisions? N N  Walking or climbing stairs? N N  Dressing or bathing? N N  Doing errands, shopping? N N  Preparing Food and eating ? N N  Using the Toilet? N N  In the past six months, have you accidently leaked urine? N N  Do you have problems with loss of bowel control? N N  Managing your Medications? N N  Managing your Finances? N N  Housekeeping or managing your Housekeeping? N N  Some recent data might be hidden    Patient Care Team: Claretta Fraise, MD  as PCP - General (Family Medicine)  Indicate any recent Medical Services you may have received from other than [facility] providers in the past year (date may be approximate).     Assessment:   This is a routine wellness examination for Shawn Mccall.  Hearing/Vision screen Hearing Screening - Comments:: Denies hearing difficulties  Vision Screening - Comments:: Denies vision difficulties - behind on routine eye exams with [facility]  Dietary issues and exercise activities discussed: Current Exercise Habits: Home exercise routine, Type of exercise: walking;strength training/weights;Other - see comments (gym twice per week; working on his 75 acres daily), Time (Minutes): 60, Frequency (Times/Week): 5, Weekly Exercise (Minutes/Week): 300, Intensity: Moderate, Exercise limited by: None identified   Goals Addressed  This Visit's Progress    Exercise 150 min/wk Moderate Activity   On track      Depression Screen PHQ 2/9 Scores 05/29/2021 05/29/2021 12/01/2020 12/02/2019 05/20/2018 05/08/2018 04/07/2018  PHQ - 2 Score 0 0 0 0 0 6 0  PHQ- 9 Score - - - - - 6 -    Fall Risk Fall Risk  05/29/2021 05/29/2021 12/01/2020 12/02/2019 05/20/2018  Falls in the past year? 0 0 0 0 0  Number falls in past yr: 0 0 - - -  Injury with Fall? 0 0 - - -  Risk for fall due to : Other (Comment) Other (Comment) - - -  Risk for fall due to: Comment hx of cerebellar stroke, intermittent dizziness intermittent dizziness, hx of cerebellar stroke - - -  Follow up Falls prevention discussed Falls prevention discussed - Falls evaluation completed -    FALL RISK PREVENTION PERTAINING TO THE HOME:  Any stairs in or around the home? Yes  If so, are there any without handrails? No  Home free of loose throw rugs in walkways, pet beds, electrical cords, etc? Yes  Adequate lighting in your home to reduce risk of falls? Yes   ASSISTIVE DEVICES UTILIZED TO PREVENT FALLS:  Life alert? No  Use of a cane, walker  or w/c? No  Grab bars in the bathroom? No  Shower chair or bench in shower? No  Elevated toilet seat or a handicapped toilet? No   TIMED UP AND GO:  Was the test performed? No . Telephonic visit  Cognitive Function:     6CIT Screen 05/29/2021  What Year? 0 points  What month? 0 points  What time? 0 points  Count back from 20 0 points  Months in reverse 0 points  Repeat phrase 0 points  Total Score 0    Immunizations Immunization History  Administered Date(s) Administered   PFIZER(Purple Top)SARS-COV-2 Vaccination 09/04/2019, 09/29/2019, 04/18/2020   Pfizer Covid-19 Vaccine Bivalent Booster 48yrs & up 05/04/2021    TDAP status: Due, Education has been provided regarding the importance of this vaccine. Advised may receive this vaccine at local pharmacy or Health Dept. Aware to provide a copy of the vaccination record if obtained from local pharmacy or Health Dept. Verbalized acceptance and understanding.  Flu Vaccine status: Declined, Education has been provided regarding the importance of this vaccine but patient still declined. Advised may receive this vaccine at local pharmacy or Health Dept. Aware to provide a copy of the vaccination record if obtained from local pharmacy or Health Dept. Verbalized acceptance and understanding.  Pneumococcal vaccine status: Declined,  Education has been provided regarding the importance of this vaccine but patient still declined. Advised may receive this vaccine at local pharmacy or Health Dept. Aware to provide a copy of the vaccination record if obtained from local pharmacy or Health Dept. Verbalized acceptance and understanding.   Covid-19 vaccine status: Completed vaccines  Qualifies for Shingles Vaccine? Yes   Zostavax completed No   Shingrix Completed?: No.    Education has been provided regarding the importance of this vaccine. Patient has been advised to call insurance company to determine out of pocket expense if they have not yet  received this vaccine. Advised may also receive vaccine at local pharmacy or Health Dept. Verbalized acceptance and understanding.  Screening Tests Health Maintenance  Topic Date Due   TETANUS/TDAP  Never done   COLONOSCOPY (Pts 45-35yrs Insurance coverage will need to be confirmed)  Never done   Zoster Vaccines-  Shingrix (1 of 2) Never done   Pneumonia Vaccine 42+ Years old (1 - PCV) Never done   COVID-19 Vaccine  Completed   Hepatitis C Screening  Completed   HPV VACCINES  Aged Out   INFLUENZA VACCINE  Discontinued    Health Maintenance  Health Maintenance Due  Topic Date Due   TETANUS/TDAP  Never done   COLONOSCOPY (Pts 45-34yrs Insurance coverage will need to be confirmed)  Never done   Zoster Vaccines- Shingrix (1 of 2) Never done   Pneumonia Vaccine 33+ Years old (1 - PCV) Never done    Colorectal cancer screening: Ordered Cologuard today  Lung Cancer Screening: (Low Dose CT Chest recommended if Age 79-80 years, 30 pack-year currently smoking OR have quit w/in 15years.) does not qualify  Additional Screening:  Hepatitis C Screening: does qualify; Completed 12/02/2020  Vision Screening: Recommended annual ophthalmology exams for early detection of glaucoma and other disorders of the eye. Is the patient up to date with their annual eye exam?  No  Who is the provider or what is the name of the office in which the patient attends annual eye exams? [place] If pt is not established with a provider, would they like to be referred to a provider to establish care? No .   Dental Screening: Recommended annual dental exams for proper oral hygiene  Community Resource Referral / Chronic Care Management: CRR required this visit?  No   CCM required this visit?  No      Plan:     I have personally reviewed and noted the following in the patient's chart:   Medical and social history Use of alcohol, tobacco or illicit drugs  Current medications and supplements  including opioid prescriptions. Patient is not currently taking opioid prescriptions. Functional ability and status Nutritional status Physical activity Advanced directives List of other physicians Hospitalizations, surgeries, and ER visits in previous 12 months Vitals Screenings to include cognitive, depression, and falls Referrals and appointments  In addition, I have reviewed and discussed with patient certain preventive protocols, quality metrics, and best practice recommendations. A written personalized care plan for preventive services as well as general preventive health recommendations were provided to patient.     Sandrea Hammond, LPN   72/82/0601   Nurse Notes: None

## 2021-06-07 DIAGNOSIS — Z1211 Encounter for screening for malignant neoplasm of colon: Secondary | ICD-10-CM | POA: Diagnosis not present

## 2021-06-12 LAB — COLOGUARD: COLOGUARD: POSITIVE — AB

## 2021-06-15 ENCOUNTER — Telehealth: Payer: Self-pay | Admitting: Family Medicine

## 2021-06-15 ENCOUNTER — Encounter: Payer: Self-pay | Admitting: Family Medicine

## 2021-06-15 ENCOUNTER — Other Ambulatory Visit: Payer: Self-pay | Admitting: Family Medicine

## 2021-06-15 DIAGNOSIS — R195 Other fecal abnormalities: Secondary | ICD-10-CM

## 2021-06-15 NOTE — Telephone Encounter (Signed)
Referral placed, as requested [facility] 

## 2021-06-15 NOTE — Telephone Encounter (Signed)
Patient states ok to refer to gastro as long as they accept medicare

## 2021-06-15 NOTE — Telephone Encounter (Signed)
He needs to see a GI MD. I can refer if desired.

## 2021-06-15 NOTE — Telephone Encounter (Signed)
Who do you suggest patient go to with positive colaguard results.

## 2021-06-16 ENCOUNTER — Encounter: Payer: Self-pay | Admitting: Gastroenterology

## 2021-06-28 ENCOUNTER — Encounter: Payer: Self-pay | Admitting: *Deleted

## 2021-07-11 ENCOUNTER — Encounter: Payer: Self-pay | Admitting: Gastroenterology

## 2021-07-11 ENCOUNTER — Ambulatory Visit (INDEPENDENT_AMBULATORY_CARE_PROVIDER_SITE_OTHER): Payer: Medicare Other | Admitting: Gastroenterology

## 2021-07-11 VITALS — BP 160/72 | HR 68 | Ht 73.0 in | Wt 208.8 lb

## 2021-07-11 DIAGNOSIS — R195 Other fecal abnormalities: Secondary | ICD-10-CM

## 2021-07-11 MED ORDER — SUTAB 1479-225-188 MG PO TABS: 1.0000 | ORAL_TABLET | Freq: Once | ORAL | 0 refills | Status: AC

## 2021-07-11 NOTE — Progress Notes (Signed)
HPI : Shawn Mccall is a very pleasant 71 year old male with a remote history of stroke and cerebral AVM who is referred to [place] by Dr. Claretta Fraise after a positive Cologuard test.  The patient has never had any sort of colon cancer screening until this Cologuard.  He denies any history of overt GI bleeding such as hematochezia or melena.  He denies any chronic GI symptoms such as abdominal pain, constipation, diarrhea, nausea/vomiting, frequent heartburn/acid regurgitation, dysphagia.  He has no known cardiopulmonary comorbidities and currently denies any concerning symptoms such as chest pain/pressure, shortness of breath, palpitations, lightheadedness/dizziness. He has no family history of colon cancer. No family history of CRC    Past Medical History:  Diagnosis Date   Allergy 1960's   hay fever - mild   AVM (arteriovenous malformation)    Kidney stones    Stroke [facility]) diagnosed 2016 at [facility]   emergency room visit - mri/cat scan detected it     Past Surgical History:  Procedure Laterality Date   BACK SURGERY  07/09/1996   L5-S1 partial disectomy    INGUINAL HERNIA REPAIR  07/09/1968   SPINE SURGERY  removal detached disc segment mid 90's   not sure this qualifies as "spine" L5/S1 herniated disc   Family History  Problem Relation Age of Onset   Cancer Mother    Cancer Father    Neuropathy Neg Hx    Stroke Neg Hx    Social History   Tobacco Use   Smoking status: Former    Types: E-cigarettes   Smokeless tobacco: Never   Tobacco comments:    Environmental consultant. since high school  Vaping Use   Vaping Use: Former  Substance Use Topics   Alcohol use: Yes    Comment: social drinker, light   Drug use: Never   Current Outpatient Medications  Medication Sig Dispense Refill   aspirin EC 81 MG tablet Take 1 tablet (81 mg total) by mouth daily. 30 tablet 0   Melatonin 10 MG TABS Take 10 mg by mouth at bedtime.     Multiple Vitamins-Minerals (MULTIVITAMIN  ADULT PO) Take 1 tablet by mouth daily.     omega-3 acid ethyl esters (LOVAZA) 1 g capsule Take by mouth 2 (two) times daily.     No current facility-administered medications for this visit.   Allergies  Allergen Reactions   Contrast Media [Iodinated Contrast Media]      Review of Systems: All systems reviewed and negative except where noted in HPI.    No results found.  Physical Exam: BP (!) 160/72    Pulse 68    Ht 6\' 1"  (1.854 m)    Wt 208 lb 12.8 oz (94.7 kg)    BMI 27.55 kg/m  Constitutional: Pleasant,well-developed, Caucasian male in no acute distress. HEENT: Normocephalic and atraumatic. Conjunctivae are normal. No scleral icterus.  Cardiovascular: Normal rate, regular rhythm.  Pulmonary/chest: Effort normal and breath sounds normal. No wheezing, rales or rhonchi. Abdominal: Soft, nondistended, nontender. Bowel sounds active throughout. There are no masses palpable. No hepatomegaly. Extremities: no edema Neurological: Alert and oriented to person place and time. Skin: Skin is warm and dry. No rashes noted. Psychiatric: Normal mood and affect. Behavior is normal.  CBC    Component Value Date/Time   WBC 5.4 12/02/2020 0808   WBC 6.7 05/05/2015 1811   RBC 5.68 12/02/2020 0808   RBC 5.52 05/05/2015 1811   HGB 16.0 12/02/2020 0808   HCT 47.2 12/02/2020 0808  PLT 201 12/02/2020 0808   MCV 83 12/02/2020 0808   MCH 28.2 12/02/2020 0808   MCH 29.3 05/05/2015 1811   MCHC 33.9 12/02/2020 0808   MCHC 34.4 05/05/2015 1811   RDW 12.0 12/02/2020 0808   LYMPHSABS 1.6 12/02/2020 0808   EOSABS 0.1 12/02/2020 0808   BASOSABS 0.0 12/02/2020 0808    CMP     Component Value Date/Time   NA 142 12/02/2020 0808   K 4.4 12/02/2020 0808   CL 105 12/02/2020 0808   CO2 23 12/02/2020 0808   GLUCOSE 95 12/02/2020 0808   GLUCOSE 98 05/05/2015 1811   BUN 13 12/02/2020 0808   CREATININE 1.00 12/02/2020 0808   CALCIUM 9.5 12/02/2020 0808   PROT 6.3 12/02/2020 0808   ALBUMIN 4.2  12/02/2020 0808   AST 20 12/02/2020 0808   ALT 16 12/02/2020 0808   ALKPHOS 68 12/02/2020 0808   BILITOT 0.5 12/02/2020 0808   GFRNONAA 73 12/02/2019 1450   GFRAA 84 12/02/2019 1450     ASSESSMENT AND PLAN: 71 year old male with positive Cologuard on routine initial screening.  No overt GI bleeding and no chronic GI symptoms.  No family history of colon cancer.  No cardiopulmonary comorbidities or symptoms.  Will schedule for colonoscopy.  The details, risks (including bleeding, perforation, infection, missed lesions, medication reactions and possible hospitalization or surgery if complications occur), benefits, and alternatives to colonoscopy with possible biopsy and possible polypectomy were discussed with the patient and he consents to proceed.   Positive Cologuard - Colonoscopy  CC:  Claretta Fraise, MD

## 2021-07-11 NOTE — Patient Instructions (Signed)
If you are age 73 or older, your body mass index should be between 23-30. Your Body mass index is 27.55 kg/m. If this is out of the aforementioned range listed, please consider follow up with your Primary Care Provider.  If you are age 40 or younger, your body mass index should be between 19-25. Your Body mass index is 27.55 kg/m. If this is out of the aformentioned range listed, please consider follow up with your Primary Care Provider.   You have been scheduled for a colonoscopy. Please follow written instructions given to you at your visit today.  Please pick up your prep supplies at the pharmacy within the next 1-3 days. If you use inhalers (even only as needed), please bring them with you on the day of your procedure.   The [place] [facility] providers would like to encourage you to use [facility] to communicate with providers for non-urgent requests or questions.  Due to long hold times on the telephone, sending your provider a message by [facility] may be a faster and more efficient way to get a response.  Please allow 48 business hours for a response.  Please remember that this is for non-urgent requests.   It was a pleasure to see you today!  Thank you for trusting me with your gastrointestinal care!    Scott E.Candis Schatz, MD

## 2021-07-12 ENCOUNTER — Telehealth: Payer: Self-pay | Admitting: Gastroenterology

## 2021-07-12 ENCOUNTER — Other Ambulatory Visit: Payer: Self-pay

## 2021-07-12 MED ORDER — PLENVU 140 G PO SOLR: ORAL | 0 refills | Status: DC

## 2021-07-12 NOTE — Telephone Encounter (Signed)
Sent plenvu to patients pharmacy.

## 2021-07-12 NOTE — Telephone Encounter (Signed)
Inbound call from patient stating he need prep Plenvu for upcoming procedure sent to [facility] in [facility]

## 2021-08-02 ENCOUNTER — Encounter: Payer: Self-pay | Admitting: Gastroenterology

## 2021-08-03 ENCOUNTER — Encounter: Payer: Self-pay | Admitting: Gastroenterology

## 2021-08-03 NOTE — Telephone Encounter (Signed)
Called and answered all of his questions.

## 2021-08-03 NOTE — Telephone Encounter (Signed)
Shawn Mccall, the code for colonoscopy is [phone].  We don't use any other ones for a colonoscopy.  There are two dx codes that are in the referral.  Z12.11 and R19.5.  The letter that goes out says procedures can be around $2000.  You can give him all that info, and that we can not guarantee how his insurance will cover. We can not give an exact cost of the procedure because we do not have a price list and do not do billing.

## 2021-08-03 NOTE — Telephone Encounter (Signed)
This is all the information we have to give patients:  Your procedure is being performed in the [place] and we are considered an [place].  The average, single procedure ranges from $1400 to $4000.  We cannot bill your procedure as an " In office charge" You will receive separate bills for Professional, Facility, Pathology and Anesthesia Charges.    If you have a personal history of Colon Polyps/Colon Cancer or a Family History of Colon Polyps/Colon Cancer your insurance may consider your procedure as a "High Risk Screening" and may not be considered preventative screening.  We encourage you to contact your insurance company to verify this potential coverage and to understand we cannot guarantee how they will ultimately reimburse your charges.

## 2021-08-09 ENCOUNTER — Ambulatory Visit (AMBULATORY_SURGERY_CENTER): Payer: Medicare Other | Admitting: Gastroenterology

## 2021-08-09 ENCOUNTER — Encounter: Payer: Self-pay | Admitting: Gastroenterology

## 2021-08-09 VITALS — BP 119/79 | HR 78 | Temp 98.0°F | Resp 17 | Ht 73.0 in | Wt 208.0 lb

## 2021-08-09 DIAGNOSIS — R195 Other fecal abnormalities: Secondary | ICD-10-CM | POA: Diagnosis not present

## 2021-08-09 DIAGNOSIS — K635 Polyp of colon: Secondary | ICD-10-CM

## 2021-08-09 DIAGNOSIS — D123 Benign neoplasm of transverse colon: Secondary | ICD-10-CM | POA: Diagnosis not present

## 2021-08-09 DIAGNOSIS — Z1211 Encounter for screening for malignant neoplasm of colon: Secondary | ICD-10-CM

## 2021-08-09 DIAGNOSIS — D122 Benign neoplasm of ascending colon: Secondary | ICD-10-CM | POA: Diagnosis not present

## 2021-08-09 DIAGNOSIS — D125 Benign neoplasm of sigmoid colon: Secondary | ICD-10-CM

## 2021-08-09 DIAGNOSIS — D127 Benign neoplasm of rectosigmoid junction: Secondary | ICD-10-CM | POA: Diagnosis not present

## 2021-08-09 DIAGNOSIS — D128 Benign neoplasm of rectum: Secondary | ICD-10-CM | POA: Diagnosis not present

## 2021-08-09 DIAGNOSIS — E669 Obesity, unspecified: Secondary | ICD-10-CM | POA: Diagnosis not present

## 2021-08-09 DIAGNOSIS — D129 Benign neoplasm of anus and anal canal: Secondary | ICD-10-CM

## 2021-08-09 DIAGNOSIS — K621 Rectal polyp: Secondary | ICD-10-CM

## 2021-08-09 MED ORDER — SODIUM CHLORIDE 0.9 % IV SOLN: 500.0000 mL | Freq: Once | INTRAVENOUS | Status: DC

## 2021-08-09 NOTE — Patient Instructions (Signed)
Please read handouts provided. Continue present medications. Await pathology results.   YOU HAD AN ENDOSCOPIC PROCEDURE TODAY AT THE [facility]:   Refer to the procedure report that was given to you for any specific questions about what was found during the examination.  If the procedure report does not answer your questions, please call your gastroenterologist to clarify.  If you requested that your care partner not be given the details of your procedure findings, then the procedure report has been included in a sealed envelope for you to review at your convenience later.  YOU SHOULD EXPECT: Some feelings of bloating in the abdomen. Passage of more gas than usual.  Walking can help get rid of the air that was put into your GI tract during the procedure and reduce the bloating. If you had a lower endoscopy (such as a colonoscopy or flexible sigmoidoscopy) you may notice spotting of blood in your stool or on the toilet paper. If you underwent a bowel prep for your procedure, you may not have a normal bowel movement for a few days.  Please Note:  You might notice some irritation and congestion in your nose or some drainage.  This is from the oxygen used during your procedure.  There is no need for concern and it should clear up in a day or so.  SYMPTOMS TO REPORT IMMEDIATELY:  Following lower endoscopy (colonoscopy or flexible sigmoidoscopy):  Excessive amounts of blood in the stool  Significant tenderness or worsening of abdominal pains  Swelling of the abdomen that is new, acute  Fever of 100F or higher   For urgent or emergent issues, a gastroenterologist can be reached at any hour by calling [phone]. Do not use MyChart messaging for urgent concerns.    DIET:  We do recommend a small meal at first, but then you may proceed to your regular diet.  Drink plenty of fluids but you should avoid alcoholic beverages for 24 hours.  ACTIVITY:  You should plan to take it easy  for the rest of today and you should NOT DRIVE or use heavy machinery until tomorrow (because of the sedation medicines used during the test).    FOLLOW UP: Our staff will call the number listed on your records 48-72 hours following your procedure to check on you and address any questions or concerns that you may have regarding the information given to you following your procedure. If we do not reach you, we will leave a message.  We will attempt to reach you two times.  During this call, we will ask if you have developed any symptoms of COVID 19. If you develop any symptoms (ie: fever, flu-like symptoms, shortness of breath, cough etc.) before then, please call [phone].  If you test positive for Covid 19 in the 2 weeks post procedure, please call and report this information to us.    If any biopsies were taken you will be contacted by phone or by letter within the next 1-3 weeks.  Please call us at [phone] if you have not heard about the biopsies in 3 weeks.    SIGNATURES/CONFIDENTIALITY: You and/or your care partner have signed paperwork which will be entered into your electronic medical record.  These signatures attest to the fact that that the information above on your After Visit Summary has been reviewed and is understood.  Full responsibility of the confidentiality of this discharge information lies with you and/or your care-partner.  

## 2021-08-09 NOTE — Progress Notes (Signed)
Called to room to assist during endoscopic procedure.  Patient ID and intended procedure confirmed with present staff. Received instructions for my participation in the procedure from the performing physician.  

## 2021-08-09 NOTE — Progress Notes (Signed)
Report to PACU, RN, vss, BBS= Clear.  

## 2021-08-09 NOTE — Progress Notes (Signed)
History and Physical Interval Note:  08/09/2021 1:56 PM  Shawn Mccall  has presented today for endoscopic procedure(s), with the diagnosis of  Encounter Diagnosis  Name Primary?   Positive colorectal cancer screening using Cologuard test Yes  .  The various methods of evaluation and treatment have been discussed with the patient and/or family. After consideration of risks, benefits and other options for treatment, the patient has consented to  the endoscopic procedure(s).   The patient's history has been reviewed, patient examined, no change in status, stable for endoscopic procedure(s).  I have reviewed the patient's chart and labs.  Questions were answered to the patient's satisfaction.     Madisan Bice E. Candis Schatz, MD [facility]

## 2021-08-09 NOTE — Op Note (Signed)
[facility] Patient Name: Shawn Mccall Procedure Date: 08/09/2021 1:53 PM MRN: [id number] Endoscopist: Nicki Reaper E. Candis Schatz , MD Age: 71 Referring MD:  Date of Birth: 27-Jun-1951 Gender: Male Account #: [id number] Procedure:                Colonoscopy Indications:              Positive Cologuard test Medicines:                Monitored Anesthesia Care Procedure:                Pre-Anesthesia Assessment:                           - Prior to the procedure, a History and Physical                            was performed, and patient medications and                            allergies were reviewed. The patient's tolerance of                            previous anesthesia was also reviewed. The risks                            and benefits of the procedure and the sedation                            options and risks were discussed with the patient.                            All questions were answered, and informed consent                            was obtained. Prior Anticoagulants: The patient has                            taken no previous anticoagulant or antiplatelet                            agents. ASA Grade Assessment: II - A patient with                            mild systemic disease. After reviewing the risks                            and benefits, the patient was deemed in                            satisfactory condition to undergo the procedure.                           After obtaining informed consent, the colonoscope  was passed under direct vision. Throughout the                            procedure, the patient's blood pressure, pulse, and                            oxygen saturations were monitored continuously. The                            Olympus CF-HQ190L [id number]) Colonoscope was                            introduced through the anus and advanced to the the                            terminal ileum, with identification of  the                            appendiceal orifice and IC valve. The colonoscopy                            was performed without difficulty. The patient                            tolerated the procedure well. The quality of the                            bowel preparation was adequate. The terminal ileum,                            ileocecal valve, appendiceal orifice, and rectum                            were photographed. The bowel preparation used was                            Sutab via split dose instruction. Scope In: 2:05:24 PM Scope Out: 2:34:12 PM Scope Withdrawal Time: 0 hours 24 minutes 57 seconds  Total Procedure Duration: 0 hours 28 minutes 48 seconds  Findings:                 The perianal and digital rectal examinations were                            normal. Pertinent negatives include normal                            sphincter tone and no palpable rectal lesions.                           Two flat polyps were found in the ascending colon.                            The polyps were 3 to 10 mm in size.  These polyps                            were removed with a cold snare. Resection and                            retrieval were complete. Estimated blood loss was                            minimal.                           A 4 mm polyp was found in the transverse colon. The                            polyp was sessile. The polyp was removed with a                            cold snare. Resection and retrieval were complete.                            Estimated blood loss was minimal.                           A 12 mm polyp was found in the sigmoid colon. The                            polyp was flat. The polyp was removed with a cold                            snare. Resection and retrieval were complete.                            Estimated blood loss was minimal.                           Many flat polyps were found in the rectum. The                             polyps were 2 to 5 mm in size. Three of these                            polyps were removed with a cold snare. Resection                            and retrieval were complete. Estimated blood loss                            was minimal.                           Multiple small-mouthed diverticula were found in  the sigmoid colon, descending colon and transverse                            colon.                           The exam was otherwise normal throughout the                            examined colon.                           The terminal ileum contained a single small                            diverticulum.                           The terminal ileum appeared normal.                           The retroflexed view of the distal rectum and anal                            verge was normal and showed no anal or rectal                            abnormalities. Complications:            No immediate complications. Estimated Blood Loss:     Estimated blood loss was minimal. Impression:               - Two 3 to 10 mm polyps in the ascending colon,                            removed with a cold snare. Resected and retrieved.                           - One 4 mm polyp in the transverse colon, removed                            with a cold snare. Resected and retrieved.                           - One 12 mm polyp in the sigmoid colon, removed                            with a cold snare. Resected and retrieved.                           - Many 2 to 5 mm polyps in the rectum, three                            removed with a cold snare. Resected and retrieved.  These were consistent with hyperplastic polyps                           - Diverticulosis in the sigmoid colon, in the                            descending colon and in the transverse colon.                           - Ileal diverticulum.                           - The examined  portion of the ileum was normal.                           - The distal rectum and anal verge are normal on                            retroflexion view. Recommendation:           - Patient has a contact number available for                            emergencies. The signs and symptoms of potential                            delayed complications were discussed with the                            patient. Return to normal activities tomorrow.                            Written discharge instructions were provided to the                            patient.                           - Resume previous diet.                           - Continue present medications.                           - Await pathology results.                           - Repeat colonoscopy (date not yet determined) for                            surveillance based on pathology results. Quinlin Conant E. Candis Schatz, MD 08/09/2021 2:43:21 PM This report has been signed electronically.

## 2021-08-09 NOTE — Progress Notes (Signed)
During patients admission I went over required questions as per our flow sheets. When he was asked about his NPO status patient got upset and rather rude. He would never tell me a time for his last solid food. He kept repeating "I did what the instructions said to do." I asked several more times and said that it was important for [place] to document and he told me "it was not important and that he did what he was supposed to do." I explained that it was important and I needed to document a time, he never would give me an time but said he followed the directions so I put down 11:59 pm. I then asked about his medications and told him that I needed to reconcile his med list and he angrily told me "my medications are right, I saw them on mychart." I then explained that on our flow sheets, some needed reconciling and that was what I was doing to ensure that we had the correct medications documented on his chart. At this point the admission was complete and I left his bay. MD and recovery nurse made aware.

## 2021-08-11 ENCOUNTER — Telehealth: Payer: Self-pay

## 2021-08-11 NOTE — Telephone Encounter (Signed)
°  Follow up Call-  Call back number 08/09/2021  Post procedure Call Back phone  # [phone]  Permission to leave phone message Yes  Some recent data might be hidden     Patient questions:  Do you have a fever, pain , or abdominal swelling? No. Pain Score  0 *  Have you tolerated food without any problems? Yes.    Have you been able to return to your normal activities? Yes.    Do you have any questions about your discharge instructions: Diet   No. Medications  No. Follow up visit  No.  Do you have questions or concerns about your Care? No.  Actions: * If pain score is 4 or above: No action needed, pain <4.  Have you developed a fever since your procedure? no  2.   Have you had an respiratory symptoms (SOB or cough) since your procedure? no  3.   Have you tested positive for COVID 19 since your procedure no  4.   Have you had any family members/close contacts diagnosed with the COVID 19 since your procedure?  no   If yes to any of these questions please route to Joylene John, RN and Joella Prince, RN

## 2021-08-17 NOTE — Progress Notes (Signed)
Shawn Mccall,   Three of the polyps that I removed during your recent procedure were completely benign but were proven to be "pre-cancerous" polyps that MAY have grown into cancers if they had not been removed.  Studies shows that at least 20% of women over age 73 and 30% of men over age 73 have pre-cancerous polyps.  Based on current nationally recognized surveillance guidelines, I recommend that you have a repeat colonoscopy in 3 years, at which time you would be 73, which is when we typically recommend stopping colon cancer screening.  If you develop any new rectal bleeding, abdominal pain or significant bowel habit changes, please contact me before then.

## 2021-12-05 ENCOUNTER — Ambulatory Visit: Payer: Medicare Other | Admitting: Family Medicine

## 2021-12-05 ENCOUNTER — Ambulatory Visit: Payer: Medicare Other

## 2022-01-18 ENCOUNTER — Ambulatory Visit: Payer: Medicare Other | Admitting: Family Medicine

## 2022-02-06 ENCOUNTER — Telehealth: Payer: Self-pay | Admitting: Family Medicine

## 2022-02-06 NOTE — Telephone Encounter (Signed)
SPOKE WITH PATIENT, DID NOT WANT TO CHANGE TIME

## 2022-02-07 ENCOUNTER — Encounter: Payer: Self-pay | Admitting: Family Medicine

## 2022-02-07 ENCOUNTER — Ambulatory Visit (INDEPENDENT_AMBULATORY_CARE_PROVIDER_SITE_OTHER): Payer: Medicare Other | Admitting: Family Medicine

## 2022-02-07 VITALS — BP 130/68 | HR 77 | Temp 98.0°F | Ht 73.0 in | Wt 214.2 lb

## 2022-02-07 DIAGNOSIS — Z13220 Encounter for screening for lipoid disorders: Secondary | ICD-10-CM

## 2022-02-07 DIAGNOSIS — I6930 Unspecified sequelae of cerebral infarction: Secondary | ICD-10-CM

## 2022-02-07 DIAGNOSIS — R202 Paresthesia of skin: Secondary | ICD-10-CM

## 2022-02-07 DIAGNOSIS — I639 Cerebral infarction, unspecified: Secondary | ICD-10-CM

## 2022-02-07 NOTE — Progress Notes (Signed)
Subjective:  Patient ID: Shawn Mccall, male    DOB: 1951/01/20  Age: 71 y.o. MRN: [id number]  CC: Medical Management of Chronic Issues   HPI Shawn Mccall presents for peripheral neuropathy. Feet feel "dead." No feeling at ball of foot and toes.   Hx of cerebellar CVA and AV malformation.      05/29/2021    8:41 AM 05/29/2021    8:25 AM 12/01/2020    8:34 AM  Depression screen PHQ 2/9  Decreased Interest 0 0 0  Down, Depressed, Hopeless 0 0 0  PHQ - 2 Score 0 0 0    History Shawn Mccall has a past medical history of Allergy (1960's), AVM (arteriovenous malformation), Kidney stones, and Stroke ([place]) (diagnosed 2016 at [facility]).   Shawn Mccall has a past surgical history that includes Back surgery (07/09/1996); Inguinal hernia repair (07/09/1968); and Spine surgery (removal detached disc segment mid 90's).   His family history includes Cancer in his father and mother.Shawn Mccall reports that Shawn Mccall has quit smoking. His smoking use included e-cigarettes. Shawn Mccall has never used smokeless tobacco. Shawn Mccall reports current alcohol use. Shawn Mccall reports that Shawn Mccall does not use drugs.    ROS Review of Systems  Constitutional:  Negative for fever.  Respiratory:  Negative for shortness of breath.   Cardiovascular:  Negative for chest pain.  Musculoskeletal:  Negative for arthralgias.  Skin:  Negative for rash.    Objective:  BP 130/68   Pulse 77   Temp 98 F (36.7 C)   Ht _0  (1.854 m)   Wt 214 lb 3.2 oz (97.2 kg)   SpO2 95%   BMI 28.26 kg/m   BP Readings from Last 3 Encounters:  02/07/22 130/68  08/09/21 119/79  07/11/21 (!) 160/72    Wt Readings from Last 3 Encounters:  02/07/22 214 lb 3.2 oz (97.2 kg)  08/09/21 208 lb (94.3 kg)  07/11/21 208 lb 12.8 oz (94.7 kg)     Physical Exam Vitals reviewed.  Constitutional:      Appearance: Shawn Mccall is well-developed.  HENT:     Head: Normocephalic and atraumatic.     Right Ear: External ear normal.     Left Ear: External ear normal.     Mouth/Throat:      Pharynx: No oropharyngeal exudate or posterior oropharyngeal erythema.  Eyes:     Pupils: Pupils are equal, round, and reactive to light.  Cardiovascular:     Rate and Rhythm: Normal rate and regular rhythm.     Heart sounds: No murmur heard. Pulmonary:     Effort: No respiratory distress.     Breath sounds: Normal breath sounds.  Musculoskeletal:        General: No tenderness or deformity.     Cervical back: Normal range of motion and neck supple.     Comments: Numbness noted at left forefoot plantar surface. Less, but present on the right with numbness at the first toe  Skin:    General: Skin is warm and dry.  Neurological:     Mental Status: Shawn Mccall is alert and oriented to person, place, and time.       Assessment & Plan:   Shawn Mccall was seen today for medical management of chronic issues.  Diagnoses and all orders for this visit:  Lipid screening -     Lipid panel  Cerebellar stroke ([place]) -     CBC with Differential/Platelet -     CMP14+EGFR  Paresthesias -     Ambulatory referral  to Neurology -     Vitamin B12       I am having Shawn Mccall. [name] maintain his Multiple Vitamins-Minerals (MULTIVITAMIN ADULT PO), aspirin EC, Melatonin, omega-3 acid ethyl esters, Alpha-Lipoic Acid, NON FORMULARY, and diphenhydrAMINE.  Allergies as of 02/07/2022       Reactions   Contrast Media [iodinated Contrast Media]    Ketamine Anxiety        Medication List        Accurate as of February 07, 2022  4:20 PM. If you have any questions, ask your nurse or doctor.          Alpha-Lipoic Acid 200 MG Caps Take 200 mg by mouth daily.   aspirin EC 81 MG tablet Take 1 tablet (81 mg total) by mouth daily.   diphenhydrAMINE 25 mg capsule Commonly known as: BENADRYL Take 25 mg by mouth daily.   Melatonin 10 MG Tabs Take 10 mg by mouth at bedtime.   MULTIVITAMIN ADULT PO Take 1 tablet by mouth daily.   NON FORMULARY Take 1 Dose by mouth daily. Vision soft gel   omega-3 acid  ethyl esters 1 g capsule Commonly known as: LOVAZA Take by mouth 2 (two) times daily.         Follow-up: Return if symptoms worsen or fail to improve.  Claretta Fraise, M.D.

## 2022-02-08 LAB — CMP14+EGFR
ALT: 14 IU/L (ref 0–44)
AST: 20 IU/L (ref 0–40)
Albumin/Globulin Ratio: 2.1 (ref 1.2–2.2)
Albumin: 4.4 g/dL (ref 3.8–4.8)
Alkaline Phosphatase: 66 IU/L (ref 44–121)
BUN/Creatinine Ratio: 15 (ref 10–24)
BUN: 16 mg/dL (ref 8–27)
Bilirubin Total: 0.3 mg/dL (ref 0.0–1.2)
CO2: 24 mmol/L (ref 20–29)
Calcium: 9.8 mg/dL (ref 8.6–10.2)
Chloride: 105 mmol/L (ref 96–106)
Creatinine, Ser: 1.09 mg/dL (ref 0.76–1.27)
Globulin, Total: 2.1 g/dL (ref 1.5–4.5)
Glucose: 103 mg/dL — ABNORMAL HIGH (ref 70–99)
Potassium: 4.6 mmol/L (ref 3.5–5.2)
Sodium: 143 mmol/L (ref 134–144)
Total Protein: 6.5 g/dL (ref 6.0–8.5)
eGFR: 72 mL/min/{1.73_m2} (ref >59)

## 2022-02-08 LAB — CBC WITH DIFFERENTIAL/PLATELET
Basophils Absolute: 0 10*3/uL (ref 0.0–0.2)
Basos: 0 %
EOS (ABSOLUTE): 0.1 10*3/uL (ref 0.0–0.4)
Eos: 1 %
Hematocrit: 47.1 % (ref 37.5–51.0)
Hemoglobin: 15.7 g/dL (ref 13.0–17.7)
Immature Grans (Abs): 0 10*3/uL (ref 0.0–0.1)
Immature Granulocytes: 1 %
Lymphocytes Absolute: 1.2 10*3/uL (ref 0.7–3.1)
Lymphs: 22 %
MCH: 28.3 pg (ref 26.6–33.0)
MCHC: 33.3 g/dL (ref 31.5–35.7)
MCV: 85 fL (ref 79–97)
Monocytes Absolute: 0.6 10*3/uL (ref 0.1–0.9)
Monocytes: 10 %
Neutrophils Absolute: 3.6 10*3/uL (ref 1.4–7.0)
Neutrophils: 66 %
Platelets: 205 10*3/uL (ref 150–450)
RBC: 5.54 x10E6/uL (ref 4.14–5.80)
RDW: 12.6 % (ref 11.6–15.4)
WBC: 5.5 10*3/uL (ref 3.4–10.8)

## 2022-02-08 LAB — LIPID PANEL
Chol/HDL Ratio: 4 ratio (ref 0.0–5.0)
Cholesterol, Total: 145 mg/dL (ref 100–199)
HDL: 36 mg/dL — ABNORMAL LOW (ref >39)
LDL Chol Calc (NIH): 76 mg/dL (ref 0–99)
Triglycerides: 195 mg/dL — ABNORMAL HIGH (ref 0–149)
VLDL Cholesterol Cal: 33 mg/dL (ref 5–40)

## 2022-02-08 LAB — VITAMIN B12: Vitamin B-12: 579 pg/mL (ref 232–1245)

## 2022-02-09 ENCOUNTER — Other Ambulatory Visit: Payer: Self-pay

## 2022-02-09 ENCOUNTER — Telehealth: Payer: Self-pay | Admitting: Family Medicine

## 2022-02-09 NOTE — Progress Notes (Signed)
Hello Shawn Mccall,  Your lab result is normal and/or stable.Some minor variations that are not significant are commonly marked abnormal, but do not represent any medical problem for you.  Best regards, Willys Salvino, M.D.

## 2022-02-09 NOTE — Telephone Encounter (Signed)
Called and spoke to patient - update med list

## 2022-02-14 ENCOUNTER — Ambulatory Visit: Payer: Medicare Other | Admitting: Family Medicine

## 2022-03-13 ENCOUNTER — Ambulatory Visit (INDEPENDENT_AMBULATORY_CARE_PROVIDER_SITE_OTHER): Payer: Medicare Other | Admitting: Neurology

## 2022-03-13 ENCOUNTER — Encounter: Payer: Self-pay | Admitting: Neurology

## 2022-03-13 VITALS — BP 129/74 | HR 80 | Ht 72.0 in | Wt 216.0 lb

## 2022-03-13 DIAGNOSIS — R7309 Other abnormal glucose: Secondary | ICD-10-CM

## 2022-03-13 DIAGNOSIS — R200 Anesthesia of skin: Secondary | ICD-10-CM | POA: Diagnosis not present

## 2022-03-13 DIAGNOSIS — M5442 Lumbago with sciatica, left side: Secondary | ICD-10-CM

## 2022-03-13 DIAGNOSIS — G8929 Other chronic pain: Secondary | ICD-10-CM | POA: Diagnosis not present

## 2022-03-13 DIAGNOSIS — Z9889 Other specified postprocedural states: Secondary | ICD-10-CM | POA: Diagnosis not present

## 2022-03-13 DIAGNOSIS — M5441 Lumbago with sciatica, right side: Secondary | ICD-10-CM
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
## 2022-03-13 NOTE — Patient Instructions (Addendum)
Blood work for different cause of peripheral neuropathy Wiat and see if we find an answer in the blood work if not can offer genetic testing, also offer skin biopsy small-fiber neuropathy. Emg/ncas Chronic venous insufficicency? Walk to Dr. Livia Snellen, may need a vascular work up MRI lumbar spine   Peripheral Neuropathy Peripheral neuropathy is a type of nerve damage. It affects nerves that carry signals between the spinal cord and the arms, legs, and the rest of the body (peripheral nerves). It does not affect nerves in the spinal cord or brain. In peripheral neuropathy, one nerve or a group of nerves may be damaged. Peripheral neuropathy is a broad category that includes many specific nerve disorders, like diabetic neuropathy, hereditary neuropathy, and carpal tunnel syndrome. What are the causes? This condition may be caused by: Certain diseases, such as: Diabetes. This is the most common cause of peripheral neuropathy. Autoimmune diseases, such as rheumatoid arthritis and systemic lupus erythematosus. Nerve diseases that are passed from parent to child (inherited). Kidney disease. Thyroid disease. Other causes may include: Nerve injury. Pressure or stress on a nerve that lasts a long time. Lack (deficiency) of B vitamins. This can result from alcoholism, poor diet, or a restricted diet. Infections. Some medicines, such as cancer medicines (chemotherapy). Poisonous (toxic) substances, such as lead and mercury. Too little blood flowing to the legs. In some cases, the cause of this condition is not known. What are the signs or symptoms? Symptoms of this condition depend on which of your nerves is damaged. Symptoms in the legs, hands, and arms can include: Loss of feeling (numbness) in the feet, hands, or both. Tingling in the feet, hands, or both. Burning pain. Very sensitive skin. Weakness. Not being able to move a part of the body (paralysis). Clumsiness or poor coordination. Muscle  twitching. Loss of balance. Symptoms in other parts of the body can include: Not being able to control your bladder. Feeling dizzy. Sexual problems. How is this diagnosed? Diagnosing and finding the cause of peripheral neuropathy can be difficult. Your health care provider will take your medical history and do a physical exam. A neurological exam will also be done. This involves checking things that are affected by your brain, spinal cord, and nerves (nervous system). For example, your health care provider will check your reflexes, how you move, and what you can feel. You may have other tests, such as: Blood tests. Electromyogram (EMG) and nerve conduction tests. These tests check nerve function and how well the nerves are controlling the muscles. Imaging tests, such as a CT scan or MRI, to rule out other causes of your symptoms. Removing a small piece of nerve to be examined in a lab (nerve biopsy). Removing and examining a small amount of the fluid that surrounds the brain and spinal cord (lumbar puncture). How is this treated? Treatment for this condition may involve: Treating the underlying cause of the neuropathy, such as diabetes, kidney disease, or vitamin deficiencies. Stopping medicines that can cause neuropathy, such as chemotherapy. Medicine to help relieve pain. Medicines may include: Prescription or over-the-counter pain medicine. Anti-seizure medicine. Antidepressants. Pain-relieving patches that are applied to painful areas of skin. Surgery to relieve pressure on a nerve or to destroy a nerve that is causing pain. Physical therapy to help improve movement and balance. Devices to help you move around (assistive devices). Follow these instructions at home: Medicines Take over-the-counter and prescription medicines only as told by your health care provider. Do not take any other medicines without  first asking your health care provider. Ask your health care provider if the  medicine prescribed to you requires you to avoid driving or using machinery. Lifestyle  Do not use any products that contain nicotine or tobacco. These products include cigarettes, chewing tobacco, and vaping devices, such as e-cigarettes. Smoking keeps blood from reaching damaged nerves. If you need help quitting, ask your health care provider. Avoid or limit alcohol. Too much alcohol can cause a vitamin B deficiency, and vitamin B is needed for healthy nerves. Eat a healthy diet. This includes: Eating foods that are high in fiber, such as beans, whole grains, and fresh fruits and vegetables. Limiting foods that are high in fat and processed sugars, such as fried or sweet foods. General instructions  If you have diabetes, work closely with your health care provider to keep your blood sugar under control. If you have numbness in your feet: Check every day for signs of injury or infection. Watch for redness, warmth, and swelling. Wear padded socks and comfortable shoes. These help protect your feet. Develop a good support system. Living with peripheral neuropathy can be stressful. Consider talking with a mental health specialist or joining a support group. Use assistive devices and attend physical therapy as told by your health care provider. This may include using a walker or a cane. Keep all follow-up visits. This is important. Where to find more information [employer] of Neurological Disorders: [email or web address] Contact a health care provider if: You have new signs or symptoms of peripheral neuropathy. You are struggling emotionally from dealing with peripheral neuropathy. Your pain is not well controlled. Get help right away if: You have an injury or infection that is not healing normally. You develop new weakness in an arm or leg. You have fallen or do so frequently. Summary Peripheral neuropathy is when the nerves in the arms or legs are damaged, resulting in numbness,  weakness, or pain. There are many causes of peripheral neuropathy, including diabetes, pinched nerves, vitamin deficiencies, autoimmune disease, and hereditary conditions. Diagnosing and finding the cause of peripheral neuropathy can be difficult. Your health care provider will take your medical history, do a physical exam, and do tests, including blood tests and nerve function tests. Treatment involves treating the underlying cause of the neuropathy and taking medicines to help control pain. Physical therapy and assistive devices may also help. This information is not intended to replace advice given to you by your health care provider. Make sure you discuss any questions you have with your health care provider. Document Revised: 02/28/2021 Document Reviewed: 02/28/2021 Elsevier Patient Education  2023 [place] is a test to check how well your muscles and nerves are working. This procedure includes the combined use of electromyogram (EMG) and nerve conduction study (NCS). EMG is used to evaluate muscles and the nerves that control those muscles. NCS, which is also called electroneurogram, measures how well your nerves conduct electricity. The procedures should be done together to check if your muscles and nerves are healthy. If the results of the tests are abnormal, this may indicate disease or injury, such as a neuromuscular disease or peripheral nerve damage. Tell a health care provider about: Any allergies you have. All medicines you are taking, including vitamins, herbs, eye drops, creams, and over-the-counter medicines. Any bleeding problems you have. Any surgeries you have had. Any medical conditions you have. What are the risks? Generally, this is a safe procedure. However, problems may occur, including: Bleeding or bruising. Infection where  the electrodes were inserted. What happens before the test? Medicines Take all of your usually  prescribed medications before this testing is performed. Do not stop your blood thinners unless advised by your prescribing physician. General instructions Your health care provider may ask you to warm the limb that will be checked with warm water, hot pack, or wrapping the limb in a blanket. Do not use lotions or creams on the same day that you will be having the procedure. What happens during the test? For EMG  Your health care provider will ask you to stay in a position so that the muscle being studied can be accessed. You will be sitting or lying down. You may be given a medicine to numb the area (local anesthetic) and the skin will be disinfected. A very thin needle that has an electrode will be inserted into your muscle, one muscle at a time. Typically, multiple muscles are evaluated during a single study. Another small electrode will be placed on your skin near the muscle. Your health care provider will ask you to continue to remain still. The electrodes will record the electrical activity of your muscles. You may see this on a monitor or hear it in the room. After your muscles have been studied at rest, your health care provider will ask you to contract or flex your muscles. The electrodes will record the electrical activity of your muscles. Your health care provider will remove the electrodes and the electrode needle when the procedure is finished. The procedure may vary among health care providers and hospitals. For NCS  An electrode that records your nerve activity (recording electrode) will be placed on your skin by the muscle that is being studied. An electrode that is used as a reference (reference electrode) will be placed near the recording electrode. A paste or gel will be applied to your skin between the recording electrode and the reference electrode. Your nerve will be stimulated with a mild shock. The speed of the nerves and strength of response is recorded by the  electrodes. Your health care provider will remove the electrodes and the gel when the procedure is finished. The procedure may vary among health care providers and hospitals. What can I expect after the test? It is up to you to get your test results. Ask your health care provider, or the department that is doing the test, when your results will be ready. Your health care provider may: Give you medicines for any pain. Monitor the insertion sites to make sure that bleeding stops. You should be able to drive yourself to and from the test. Discomfort can persist for a few hours after the test, but should be better the next day. Contact a health care provider if: You have swelling, redness, or drainage at any of the insertion sites. Summary Electromyoneurogram is a test to check how well your muscles and nerves are working. If the results of the tests are abnormal, this may indicate disease or injury. This is a safe procedure. However, problems may occur, such as bleeding and infection. Your health care provider will do two tests to complete this procedure. One checks your muscles (EMG) and another checks your nerves (NCS). It is up to you to get your test results. Ask your health care provider, or the department that is doing the test, when your results will be ready. This information is not intended to replace advice given to you by your health care provider. Make sure you discuss any questions you  have with your health care provider. Document Revised: 03/08/2021 Document Reviewed: 02/05/2021 Elsevier Patient Education  [place].

## 2022-03-14 ENCOUNTER — Telehealth: Payer: Self-pay | Admitting: Neurology

## 2022-03-14 NOTE — Telephone Encounter (Signed)
medicare NPR sent to GI

## 2022-03-19 LAB — MULTIPLE MYELOMA PANEL, SERUM
Albumin SerPl Elph-Mcnc: 4.1 g/dL (ref 2.9–4.4)
Albumin/Glob SerPl: 1.7 (ref 0.7–1.7)
Alpha 1: 0.2 g/dL (ref 0.0–0.4)
Alpha2 Glob SerPl Elph-Mcnc: 0.6 g/dL (ref 0.4–1.0)
B-Globulin SerPl Elph-Mcnc: 0.9 g/dL (ref 0.7–1.3)
Gamma Glob SerPl Elph-Mcnc: 0.8 g/dL (ref 0.4–1.8)
Globulin, Total: 2.5 g/dL (ref 2.2–3.9)
IgA/Immunoglobulin A, Serum: 176 mg/dL (ref 61–437)
IgG (Immunoglobin G), Serum: 844 mg/dL (ref 603–1613)
IgM (Immunoglobulin M), Srm: 44 mg/dL (ref 15–143)
Total Protein: 6.6 g/dL (ref 6.0–8.5)

## 2022-03-19 LAB — HEAVY METALS, BLOOD
Arsenic: 2 ug/L (ref 0–9)
Lead, Blood: 1.5 ug/dL (ref 0.0–3.4)
Mercury: 2 ug/L (ref 0.0–14.9)

## 2022-03-19 LAB — ANA COMPREHENSIVE PANEL
Anti JO-1: <0.2 AI (ref 0.0–0.9)
Centromere Ab Screen: <0.2 AI (ref 0.0–0.9)
Chromatin Ab SerPl-aCnc: <0.2 AI (ref 0.0–0.9)
ENA RNP Ab: <0.2 AI (ref 0.0–0.9)
ENA SM Ab Ser-aCnc: <0.2 AI (ref 0.0–0.9)
ENA SSA (RO) Ab: 0.2 AI (ref 0.0–0.9)
ENA SSB (LA) Ab: <0.2 AI (ref 0.0–0.9)
Scleroderma (Scl-70) (ENA) Antibody, IgG: <0.2 AI (ref 0.0–0.9)
dsDNA Ab: <1 IU/mL (ref 0–9)

## 2022-03-19 LAB — FOLATE: Folate: >20 ng/mL (ref >3.0)

## 2022-03-19 LAB — HEMOGLOBIN A1C
Est. average glucose Bld gHb Est-mCnc: 114 mg/dL
Hgb A1c MFr Bld: 5.6 % (ref 4.8–5.6)

## 2022-03-19 LAB — VITAMIN B6: Vitamin B6: 16.5 ug/L (ref 3.4–65.2)

## 2022-03-19 LAB — VITAMIN B1: Thiamine: 170.4 nmol/L (ref 66.5–200.0)

## 2022-03-21 ENCOUNTER — Ambulatory Visit
Admission: RE | Admit: 2022-03-21 | Discharge: 2022-03-21 | Disposition: A | Discharge status: Home / Self Care | Payer: Medicare Other | Source: Ambulatory Visit | Attending: Neurology | Admitting: Neurology

## 2022-03-21 DIAGNOSIS — G8929 Other chronic pain: Secondary | ICD-10-CM

## 2022-03-21 DIAGNOSIS — M5416 Radiculopathy, lumbar region: Secondary | ICD-10-CM | POA: Diagnosis not present

## 2022-03-21 DIAGNOSIS — R200 Anesthesia of skin: Secondary | ICD-10-CM

## 2022-03-21 DIAGNOSIS — Z9889 Other specified postprocedural states: Secondary | ICD-10-CM

## 2022-03-26 ENCOUNTER — Encounter: Payer: Self-pay | Admitting: Neurology

## 2022-03-29 NOTE — Telephone Encounter (Signed)
Yes sir that should be fine. I will let Dr Jaynee Eagles know your preference to wait for now.  ===View-only below this line===   ----- Message -----      From:Delaine D Walter      Sent:03/29/2022 11:25 AM EDT        KD:[id number] Appointment Schedule Request Message List   Subject:Appointment Scheduled  Hello - you know I really don't know at this point, maybe I can spend some more time researching both options on my own and go from there?  My knee jerk reaction is to not have any further surgery, so I'm king of leaning towards the ortho option, but need to do a little more research on my own.    Can I refer back to this message in the near future and let you know my decision?  Thanks a Ulanda Edison   ----- Message -----      From: (proxy for Nurse Theresia Bough)      Sent:03/29/2022 11:09 AM EDT        PY:[id number] D Ovid thank you for letting us know. The appointment has been canceled. I see you are interested in seeing another doctor for evaluation for injections or surgery. Do you want to see an orthopedic doctor or a neurosurgeon? Do you have a specific doctor you want to see?   ----- Message -----      From:Indy D Asman      Sent:03/29/2022 10:35 AM EDT        AS:[id number] Appointment Schedule Request Message List   Subject:Appointment Scheduled  I called and left a message and am also replying here that I'm not really interested in scheduling a video appointment but do sincerely appreciate the help so far.    Regards, [name]. Tamala Julian

## 2022-04-16 ENCOUNTER — Telehealth: Payer: Medicare Other | Admitting: Neurology

## 2022-06-13 ENCOUNTER — Encounter: Payer: Self-pay | Admitting: Adult Health

## 2022-06-13 ENCOUNTER — Ambulatory Visit (INDEPENDENT_AMBULATORY_CARE_PROVIDER_SITE_OTHER): Payer: Medicare Other

## 2022-06-13 VITALS — Ht 72.0 in | Wt 216.0 lb

## 2022-06-13 DIAGNOSIS — Z0000 Encounter for general adult medical examination without abnormal findings: Secondary | ICD-10-CM | POA: Diagnosis not present

## 2022-06-13 NOTE — Patient Instructions (Signed)
Shawn Mccall , Thank you for taking time to come for your Medicare Wellness Visit. I appreciate your ongoing commitment to your health goals. Please review the following plan we discussed and let me know if I can assist you in the future.   These are the goals we discussed:  Goals      Exercise 150 min/wk Moderate Activity        This is a list of the screening recommended for you and due dates:  Health Maintenance  Topic Date Due   DTaP/Tdap/Td vaccine (1 - Tdap) Never done   Zoster (Shingles) Vaccine (1 of 2) Never done   COVID-19 Vaccine (5 - 2023-24 season) 03/09/2022   Pneumonia Vaccine (1 - PCV) 02/08/2023*   Medicare Annual Wellness Visit  06/14/2023   Colon Cancer Screening  08/09/2024   Hepatitis C Screening: USPSTF Recommendation to screen - Ages 18-79 yo.  Completed   HPV Vaccine  Aged Out   Flu Shot  Discontinued  *Topic was postponed. The date shown is not the original due date.    Advanced directives: Advance directive discussed with you today. I have provided a copy for you to complete at home and have notarized. Once this is complete please bring a copy in to our office so we can scan it into your chart.   Conditions/risks identified: Aim for 30 minutes of exercise or brisk walking, 6-8 glasses of water, and 5 servings of fruits and vegetables each day.   Next appointment: Follow up in one year for your annual wellness visit.   Preventive Care 73 Years and Older, Male  Preventive care refers to lifestyle choices and visits with your health care provider that can promote health and wellness. What does preventive care include? A yearly physical exam. This is also called an annual well check. Dental exams once or twice a year. Routine eye exams. Ask your health care provider how often you should have your eyes checked. Personal lifestyle choices, including: Daily care of your teeth and gums. Regular physical activity. Eating a healthy diet. Avoiding tobacco and  drug use. Limiting alcohol use. Practicing safe sex. Taking low doses of aspirin every day. Taking vitamin and mineral supplements as recommended by your health care provider. What happens during an annual well check? The services and screenings done by your health care provider during your annual well check will depend on your age, overall health, lifestyle risk factors, and family history of disease. Counseling  Your health care provider may ask you questions about your: Alcohol use. Tobacco use. Drug use. Emotional well-being. Home and relationship well-being. Sexual activity. Eating habits. History of falls. Memory and ability to understand (cognition). Work and work Statistician. Screening  You may have the following tests or measurements: Height, weight, and BMI. Blood pressure. Lipid and cholesterol levels. These may be checked every 5 years, or more frequently if you are over 18 years old. Skin check. Lung cancer screening. You may have this screening every year starting at age 34 if you have a 30-pack-year history of smoking and currently smoke or have quit within the past 15 years. Fecal occult blood test (FOBT) of the stool. You may have this test every year starting at age 41. Flexible sigmoidoscopy or colonoscopy. You may have a sigmoidoscopy every 5 years or a colonoscopy every 10 years starting at age 37. Prostate cancer screening. Recommendations will vary depending on your family history and other risks. Hepatitis C blood test. Hepatitis B blood test. Sexually transmitted  disease (STD) testing. Diabetes screening. This is done by checking your blood sugar (glucose) after you have not eaten for a while (fasting). You may have this done every 1-3 years. Abdominal aortic aneurysm (AAA) screening. You may need this if you are a current or former smoker. Osteoporosis. You may be screened starting at age 41 if you are at high risk. Talk with your health care provider  about your test results, treatment options, and if necessary, the need for more tests. Vaccines  Your health care provider may recommend certain vaccines, such as: Influenza vaccine. This is recommended every year. Tetanus, diphtheria, and acellular pertussis (Tdap, Td) vaccine. You may need a Td booster every 10 years. Zoster vaccine. You may need this after age 61. Pneumococcal 13-valent conjugate (PCV13) vaccine. One dose is recommended after age 73. Pneumococcal polysaccharide (PPSV23) vaccine. One dose is recommended after age 73. Talk to your health care provider about which screenings and vaccines you need and how often you need them. This information is not intended to replace advice given to you by your health care provider. Make sure you discuss any questions you have with your health care provider. Document Released: 07/22/2015 Document Revised: 03/14/2016 Document Reviewed: 04/26/2015 Elsevier Interactive Patient Education  2017 [place] Prevention in the Home Falls can cause injuries. They can happen to people of all ages. There are many things you can do to make your home safe and to help prevent falls. What can I do on the outside of my home? Regularly fix the edges of walkways and driveways and fix any cracks. Remove anything that might make you trip as you walk through a door, such as a raised step or threshold. Trim any bushes or trees on the path to your home. Use bright outdoor lighting. Clear any walking paths of anything that might make someone trip, such as rocks or tools. Regularly check to see if handrails are loose or broken. Make sure that both sides of any steps have handrails. Any raised decks and porches should have guardrails on the edges. Have any leaves, snow, or ice cleared regularly. Use sand or salt on walking paths during winter. Clean up any spills in your garage right away. This includes oil or grease spills. What can I do in the  bathroom? Use night lights. Install grab bars by the toilet and in the tub and shower. Do not use towel bars as grab bars. Use non-skid mats or decals in the tub or shower. If you need to sit down in the shower, use a plastic, non-slip stool. Keep the floor dry. Clean up any water that spills on the floor as soon as it happens. Remove soap buildup in the tub or shower regularly. Attach bath mats securely with double-sided non-slip rug tape. Do not have throw rugs and other things on the floor that can make you trip. What can I do in the bedroom? Use night lights. Make sure that you have a light by your bed that is easy to reach. Do not use any sheets or blankets that are too big for your bed. They should not hang down onto the floor. Have a firm chair that has side arms. You can use this for support while you get dressed. Do not have throw rugs and other things on the floor that can make you trip. What can I do in the kitchen? Clean up any spills right away. Avoid walking on wet floors. Keep items that you use a  lot in easy-to-reach places. If you need to reach something above you, use a strong step stool that has a grab bar. Keep electrical cords out of the way. Do not use floor polish or wax that makes floors slippery. If you must use wax, use non-skid floor wax. Do not have throw rugs and other things on the floor that can make you trip. What can I do with my stairs? Do not leave any items on the stairs. Make sure that there are handrails on both sides of the stairs and use them. Fix handrails that are broken or loose. Make sure that handrails are as long as the stairways. Check any carpeting to make sure that it is firmly attached to the stairs. Fix any carpet that is loose or worn. Avoid having throw rugs at the top or bottom of the stairs. If you do have throw rugs, attach them to the floor with carpet tape. Make sure that you have a light switch at the top of the stairs and the  bottom of the stairs. If you do not have them, ask someone to add them for you. What else can I do to help prevent falls? Wear shoes that: Do not have high heels. Have rubber bottoms. Are comfortable and fit you well. Are closed at the toe. Do not wear sandals. If you use a stepladder: Make sure that it is fully opened. Do not climb a closed stepladder. Make sure that both sides of the stepladder are locked into place. Ask someone to hold it for you, if possible. Clearly mark and make sure that you can see: Any grab bars or handrails. First and last steps. Where the edge of each step is. Use tools that help you move around (mobility aids) if they are needed. These include: Canes. Walkers. Scooters. Crutches. Turn on the lights when you go into a dark area. Replace any light bulbs as soon as they burn out. Set up your furniture so you have a clear path. Avoid moving your furniture around. If any of your floors are uneven, fix them. If there are any pets around you, be aware of where they are. Review your medicines with your doctor. Some medicines can make you feel dizzy. This can increase your chance of falling. Ask your doctor what other things that you can do to help prevent falls. This information is not intended to replace advice given to you by your health care provider. Make sure you discuss any questions you have with your health care provider. Document Released: 04/21/2009 Document Revised: 12/01/2015 Document Reviewed: 07/30/2014 Elsevier Interactive Patient Education  2017 [employer].

## 2022-06-13 NOTE — Progress Notes (Signed)
Subjective:   Shawn Mccall is a 71 y.o. male who presents for Medicare Annual/Subsequent preventive examination. I connected with  Shawn Mccall on 06/13/22 by a audio enabled telemedicine application and verified that I am speaking with the correct person using two identifiers.  Patient Location: Home  Provider Location: Home Office  I discussed the limitations of evaluation and management by telemedicine. The patient expressed understanding and agreed to proceed.  Review of Systems     Cardiac Risk Factors include: advanced age (>79mn, >>17women);male gender     Objective:    Today's Vitals   06/13/22 0820  Weight: 216 lb (98 kg)  Height: 6' (1.829 m)   Body mass index is 29.29 kg/m.     06/13/2022    8:28 AM 05/29/2021    8:22 AM 05/05/2015    4:51 PM  Advanced Directives  Does Patient Have a Medical Advance Directive? No No No  Would patient like information on creating a medical advance directive? No - Patient declined No - Patient declined No - patient declined information    Current Medications (verified) Outpatient Encounter Medications as of 06/13/2022  Medication Sig   ASHWAGANDHA PO    aspirin EC 81 MG tablet Take 1 tablet (81 mg total) by mouth daily.   BENFOTIAMINE PO    diphenhydrAMINE (BENADRYL) 25 mg capsule Take 25 mg by mouth daily.   Krill Oil (OMEGA-3) 500 MG CAPS Take 1 capsule by mouth daily.   Melatonin 10 MG TABS Take 10 mg by mouth at bedtime.   Multiple Vitamins-Minerals (MULTIVITAMIN ADULT PO) Take 1 tablet by mouth daily.   Multiple Vitamins-Minerals (VISION FORMULA/LUTEIN PO) Take 1 tablet by mouth daily.   No facility-administered encounter medications on file as of 06/13/2022.    Allergies (verified) Contrast media [iodinated contrast media] and Ketamine   History: Past Medical History:  Diagnosis Date   Allergy 1960's   hay fever - mild   AVM (arteriovenous malformation)    Kidney stones    [facility]  emergency room visit - mri/cat scan detected it   Past Surgical History:  Procedure Laterality Date   BACK SURGERY  07/09/1996   L5-S1 partial disectomy    INGUINAL HERNIA REPAIR  07/09/1968   SPINE SURGERY  removal detached disc segment mid 90's   not sure this qualifies as "spine" L5/S1 herniated disc   Family History  Problem Relation Age of Onset   Cancer Mother    Cancer Father    Neuropathy Neg Hx    Stroke Neg Hx    Colon cancer Neg Hx    Rectal cancer Neg Hx    Stomach cancer Neg Hx    Esophageal cancer Neg Hx    Social History   Socioeconomic History   Marital status: Single    Spouse name: Not on file   Number of children: 0   Years of education: Not on file   Highest education level: Not on file  Occupational History   Occupation: Retired  Tobacco Use   Smoking status: Former    Types: E-cigarettes   Smokeless tobacco: Never   Tobacco comments:    qEnvironmental consultant since high school  Vaping Use   Vaping Use: Former  Substance and Sexual Activity   Alcohol use: Yes    Comment: social drinker, light   Drug use: Never   Sexual activity: Not Currently    Birth control/protection: None  Other  Topics Concern   Not on file  Social History Narrative   Lives at home by himself.   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 - always busy working on something   Gym/work out twice a week   Caffeine use: 4 cups per day   Social Determinants of Health   Financial Resource Strain: Low Risk  (06/13/2022)   Overall Financial Resource Strain (CARDIA)    Difficulty of Paying Living Expenses: Not hard at all  Food Insecurity: No Food Insecurity (06/13/2022)   Hunger Vital Sign    Worried About Running Out of Food in the Last Year: Never true    Ran Out of Food in the Last Year: Never true  Transportation Needs: No Transportation Needs (06/13/2022)   PRAPARE - Hydrologist (Medical): No    Lack of Transportation (Non-Medical): No  Physical  Activity: Insufficiently Active (06/13/2022)   Exercise Vital Sign    Days of Exercise per Week: 3 days    Minutes of Exercise per Session: 30 min  Stress: No Stress Concern Present (06/13/2022)   [place]    Feeling of Stress : Not at all  Social Connections: Socially Isolated (06/13/2022)   Social Connection and Isolation Panel [NHANES]    Frequency of Communication with Friends and Family: More than three times a week    Frequency of Social Gatherings with Friends and Family: More than three times a week    Attends Religious Services: Never    Marine scientist or Organizations: No    Attends Music therapist: Never    Marital Status: Divorced    Tobacco Counseling Counseling given: Not Answered Tobacco comments: quit/start/quit/start etc. since high school   Clinical Intake:  Pre-visit preparation completed: Yes  Pain : No/denies pain     Nutritional Risks: None Diabetes: No  How often do you need to have someone help you when you read instructions, pamphlets, or other written materials from your doctor or pharmacy?: 1 - Never  Diabetic?no   Interpreter Needed?: No  Information entered by :: Shawn Pierini, LPN   Activities of Daily Living    06/13/2022    8:26 AM  In your present state of health, do you have any difficulty performing the following activities:  Hearing? 0  Vision? 0  Difficulty concentrating or making decisions? 0  Walking or climbing stairs? 0  Dressing or bathing? 0  Doing errands, shopping? 0  Preparing Food and eating ? N  Using the Toilet? N  In the past six months, have you accidently leaked urine? N  Do you have problems with loss of bowel control? N  Managing your Medications? N  Managing your Finances? N  Housekeeping or managing your Housekeeping? N    Patient Care Team: Claretta Fraise, MD as PCP - General (Family Medicine)  Indicate any  recent Medical Services you may have received from other than [facility] providers in the past year (date may be approximate).     Assessment:   This is a routine wellness examination for Shawn Mccall.  Hearing/Vision screen Vision Screening - Comments:: Due eye exam -Dr. Truman Hayward patient to call schedule   Dietary issues and exercise activities discussed: Current Exercise Habits: Home exercise routine, Type of exercise: walking, Time (Minutes): 30, Frequency (Times/Week): 5, Weekly Exercise (Minutes/Week): 150, Intensity: Mild, Exercise limited by: None identified   Goals Addressed  This Visit's Progress    Exercise 150 min/wk Moderate Activity   On track      Depression Screen    06/13/2022    8:24 AM 05/29/2021    8:41 AM 05/29/2021    8:25 AM 12/01/2020    8:34 AM 12/02/2019    1:49 PM 05/20/2018   12:56 PM 05/08/2018   10:38 AM  PHQ 2/9 Scores  PHQ - 2 Score 0 0 0 0 0 0 6  PHQ- 9 Score       6    Fall Risk    06/13/2022    8:23 AM 05/29/2021    8:43 AM 05/29/2021    7:47 AM 12/01/2020    8:34 AM 12/02/2019    1:49 PM  Fall Risk   Falls in the past year? 0 0 0 0 0  Number falls in past yr: 0 0 0    Injury with Fall? 0 0 0    Risk for fall due to : No Fall Risks Other (Comment) Other (Comment)    Risk for fall due to: Comment  hx of cerebellar stroke, intermittent dizziness intermittent dizziness, hx of cerebellar stroke    Follow up Falls prevention discussed Falls prevention discussed Falls prevention discussed  Falls evaluation completed    FALL RISK PREVENTION PERTAINING TO THE HOME:  Any stairs in or around the home? Yes  If so, are there any without handrails? No  Home free of loose throw rugs in walkways, pet beds, electrical cords, etc? Yes  Adequate lighting in your home to reduce risk of falls? Yes   ASSISTIVE DEVICES UTILIZED TO PREVENT FALLS:  Life alert? No  Use of a cane, walker or w/c? No  Grab bars in the bathroom? Yes  Shower chair or bench in  shower? Yes  Elevated toilet seat or a handicapped toilet? Yes        06/13/2022    8:27 AM 05/29/2021    8:29 AM  6CIT Screen  What Year? 0 points 0 points  What month? 0 points 0 points  What time? 0 points 0 points  Count back from 20 0 points 0 points  Months in reverse 0 points 0 points  Repeat phrase 0 points 0 points  Total Score 0 points 0 points    Immunizations Immunization History  Administered Date(s) Administered   PFIZER(Purple Top)SARS-COV-2 Vaccination 09/04/2019, 09/29/2019, 04/18/2020   Pfizer Covid-19 Vaccine Bivalent Booster 28yr & up 05/04/2021    TDAP status: Due, Education has been provided regarding the importance of this vaccine. Advised may receive this vaccine at local pharmacy or Health Dept. Aware to provide a copy of the vaccination record if obtained from local pharmacy or Health Dept. Verbalized acceptance and understanding.  Flu Vaccine status: Due, Education has been provided regarding the importance of this vaccine. Advised may receive this vaccine at local pharmacy or Health Dept. Aware to provide a copy of the vaccination record if obtained from local pharmacy or Health Dept. Verbalized acceptance and understanding.  Pneumococcal vaccine status: Due, Education has been provided regarding the importance of this vaccine. Advised may receive this vaccine at local pharmacy or Health Dept. Aware to provide a copy of the vaccination record if obtained from local pharmacy or Health Dept. Verbalized acceptance and understanding.  Covid-19 vaccine status: Completed vaccines  Qualifies for Shingles Vaccine? Yes   Zostavax completed No   Shingrix Completed?: No.    Education has been provided regarding the importance of this vaccine.  Patient has been advised to call insurance company to determine out of pocket expense if they have not yet received this vaccine. Advised may also receive vaccine at local pharmacy or Health Dept. Verbalized acceptance and  understanding.  Screening Tests Health Maintenance  Topic Date Due   DTaP/Tdap/Td (1 - Tdap) Never done   Zoster Vaccines- Shingrix (1 of 2) Never done   COVID-19 Vaccine (5 - 2023-24 season) 03/09/2022   Pneumonia Vaccine 73+ Years old (1 - PCV) 02/08/2023 (Originally 11/07/2013)   Medicare Annual Wellness ([place])  06/14/2023   COLONOSCOPY (Pts 45-31yr Insurance coverage will need to be confirmed)  08/09/2024   Hepatitis C Screening  Completed   HPV VACCINES  Aged Out   IHartMaintenance  Health Maintenance Due  Topic Date Due   DTaP/Tdap/Td (1 - Tdap) Never done   Zoster Vaccines- Shingrix (1 of 2) Never done   COVID-19 Vaccine (5 - 2023-24 season) 03/09/2022    Colorectal cancer screening: Type of screening: Colonoscopy. Completed 08/09/2021. Repeat every 3 years  Lung Cancer Screening: (Low Dose CT Chest recommended if Age 73-80years, 30 pack-year currently smoking OR have quit w/in 15years.) does not qualify.   Lung Cancer Screening Referral: n/a  Additional Screening:  Hepatitis C Screening: does not qualify; Completed 12/02/2020  Vision Screening: Recommended annual ophthalmology exams for early detection of glaucoma and other disorders of the eye. Is the patient up to date with their annual eye exam?  No  Who is the provider or what is the name of the office in which the patient attends annual eye exams? None ,Patient to schedule  If pt is not established with a provider, would they like to be referred to a provider to establish care? No .   Dental Screening: Recommended annual dental exams for proper oral hygiene  Community Resource Referral / Chronic Care Management: CRR required this visit?  No   CCM required this visit?  No      Plan:     I have personally reviewed and noted the following in the patient's chart:   Medical and social history Use of alcohol, tobacco or illicit drugs  Current medications and  supplements including opioid prescriptions. Patient is not currently taking opioid prescriptions. Functional ability and status Nutritional status Physical activity Advanced directives List of other physicians Hospitalizations, surgeries, and ER visits in previous 12 months Vitals Screenings to include cognitive, depression, and falls Referrals and appointments  In addition, I have reviewed and discussed with patient certain preventive protocols, quality metrics, and best practice recommendations. A written personalized care plan for preventive services as well as general preventive health recommendations were provided to patient.     LDaphane Shepherd LPN   101/0/2725  Nurse Notes: Due Flu /TDAP/Pneumonia Vaccine , Patient to schedule eye appointment

## 2022-06-18 ENCOUNTER — Other Ambulatory Visit: Payer: Self-pay | Admitting: Neurology

## 2022-06-18 ENCOUNTER — Ambulatory Visit: Payer: Medicare Other | Admitting: Adult Health

## 2022-06-18 DIAGNOSIS — M5416 Radiculopathy, lumbar region: Secondary | ICD-10-CM

## 2022-06-19 ENCOUNTER — Telehealth: Payer: Self-pay | Admitting: Neurology

## 2022-06-19 ENCOUNTER — Ambulatory Visit: Payer: Medicare Other

## 2022-06-19 NOTE — Telephone Encounter (Signed)
Referral sent to Dr. Davy Pique at [facility], phone # [phone].

## 2022-06-29 DIAGNOSIS — M48061 Spinal stenosis, lumbar region without neurogenic claudication: Secondary | ICD-10-CM | POA: Diagnosis not present

## 2022-06-29 DIAGNOSIS — Z6828 Body mass index (BMI) 28.0-28.9, adult: Secondary | ICD-10-CM | POA: Diagnosis not present

## 2022-07-17 DIAGNOSIS — M5416 Radiculopathy, lumbar region: Secondary | ICD-10-CM | POA: Diagnosis not present

## 2022-08-28 ENCOUNTER — Encounter: Payer: Self-pay | Admitting: Neurology

## 2022-08-30 ENCOUNTER — Other Ambulatory Visit: Payer: Self-pay | Admitting: Neurology

## 2022-08-30 DIAGNOSIS — M79604 Pain in right leg: Secondary | ICD-10-CM

## 2022-09-24 ENCOUNTER — Encounter: Payer: Self-pay | Admitting: Neurology

## 2022-10-08 DIAGNOSIS — S70362A Insect bite (nonvenomous), left thigh, initial encounter: Secondary | ICD-10-CM | POA: Diagnosis not present

## 2022-10-18 ENCOUNTER — Ambulatory Visit (INDEPENDENT_AMBULATORY_CARE_PROVIDER_SITE_OTHER): Payer: Medicare Other | Admitting: Neurology

## 2022-10-18 ENCOUNTER — Encounter: Payer: Self-pay | Admitting: Neurology

## 2022-10-18 VITALS — BP 128/80 | HR 77 | Ht 72.0 in | Wt 216.0 lb

## 2022-10-18 DIAGNOSIS — E8582 Wild-type transthyretin-related (ATTR) amyloidosis: Secondary | ICD-10-CM | POA: Diagnosis not present

## 2022-10-18 DIAGNOSIS — M79605 Pain in left leg: Secondary | ICD-10-CM | POA: Diagnosis not present

## 2022-10-18 DIAGNOSIS — R200 Anesthesia of skin: Secondary | ICD-10-CM

## 2022-10-18 DIAGNOSIS — M79604 Pain in right leg: Secondary | ICD-10-CM

## 2022-10-18 NOTE — Progress Notes (Signed)
History: 72 year old patient with numbness in the feet since 2015 but stable in the last 2-3 years. has Imbalance; Cavernous malformation; Cerebellar stroke; Vertigo; Idiopathic peripheral neuropathy; Chronic lower back pain; and Swollen feet on their problem list. Stiffness in the toes.  Stiffness and numbness in the balls of the feet. He has the feeling in the arch of his toes. More stiffness than anything. He stopped smoking. He has chronic low back pain but MRI was unremarkable. Feels like feet swollen but they are not. He has cramps in feet, stiffness in toes and ball of the foot, Numbness in balls of the feet, New varicose veins and some hyperpigmentation around the ankles. Serum workup has been unremarkable for etiology. Absent AJs, Patellars 2+  I explained results of emg/ncs: Conclusion: There is electrophysiologic evidence of a length dependent, axonal, moderately severe, sensorimotor polyneuropathy.   I reviewed history and results with patient.  He has a long history of smoking which could be a contributing factor.  Mother had rheumatoid arthritis and neuropathy in her feet, could be an undiagnosed rheumatologic disorder.  He also has symptoms of possible chronic venous insufficiency with hyperpigmentation around the ankles especially given the history of smoking.  I did take pictures and send them to vein and vascular to ask Dr. Verlin Dike if this was an appropriate referral, he has also developed some varicose veins, and Dr. Myra Gianotti kindly accepted a referral to evaluate him for vascular causes.  However no other cause found in a series of blood work including hemoglobin A1c (5.6).  ANA comprehensive panel, vitamin B6, vitamin B1, heavy metals, SPEP and IFE, ANCA, Lyme, rheumatoid arthritis, folate, Sjogren's antibodies, heavy metals, B12 all within normal limits.  We will follow to see if any vascular causes.   MRI of the lumbar spine was unremarkable 03/21/2022 and he denies any radicular  symptoms IMPRESSION:  reviewed images and agree MRI lumbar spine without contrast demonstrating: - At L4-5: Disc bulging and hypertrophy with mild right and moderate-severe left foraminal stenosis; also left lateral recess stenosis with displacement of the descending left L5 nerve root. - At L5-S1: Post right laminectomy changes with disc bulging resulting in mild right and moderate severe left foraminal stenosis. - At L1-2: Disc bulging and facet hypertrophy with mild spinal stenosis and mild bilateral foraminal stenosis. - Additional multilevel degenerative changes as above. - Large right renal cysts measuring 3 cm in diameter.     I spent over 45 minutes of face-to-face and non-face-to-face time with patient on the  1. Numbness   2. Leg pain, bilateral   3. Numbness of foot   4. Wild-type transthyretin-related (ATTR) amyloidosis    diagnosis.  This included previsit chart review, lab review, study review, order entry, electronic health record documentation, patient education on the different diagnostic and therapeutic options, counseling and coordination of care, risks and benefits of management, compliance, or risk factor reduction.  This does not include time spent on EMG nerve conduction study.

## 2022-10-18 NOTE — Patient Instructions (Signed)
Peripheral Neuropathy Peripheral neuropathy is a type of nerve damage. It affects nerves that carry signals between the spinal cord and the arms, legs, and the rest of the body (peripheral nerves). It does not affect nerves in the spinal cord or brain. In peripheral neuropathy, one nerve or a group of nerves may be damaged. Peripheral neuropathy is a broad category that includes many specific nerve disorders, like diabetic neuropathy, hereditary neuropathy, and carpal tunnel syndrome. What are the causes? This condition may be caused by: Certain diseases, such as: Diabetes. This is the most common cause of peripheral neuropathy. Autoimmune diseases, such as rheumatoid arthritis and systemic lupus erythematosus. Nerve diseases that are passed from parent to child (inherited). Kidney disease. Thyroid disease. Other causes may include: Nerve injury. Pressure or stress on a nerve that lasts a long time. Lack (deficiency) of B vitamins. This can result from alcoholism, poor diet, or a restricted diet. Infections. Some medicines, such as cancer medicines (chemotherapy). Poisonous (toxic) substances, such as lead and mercury. Too little blood flowing to the legs. In some cases, the cause of this condition is not known. What are the signs or symptoms? Symptoms of this condition depend on which of your nerves is damaged. Symptoms in the legs, hands, and arms can include: Loss of feeling (numbness) in the feet, hands, or both. Tingling in the feet, hands, or both. Burning pain. Very sensitive skin. Weakness. Not being able to move a part of the body (paralysis). Clumsiness or poor coordination. Muscle twitching. Loss of balance. Symptoms in other parts of the body can include: Not being able to control your bladder. Feeling dizzy. Sexual problems. How is this diagnosed? Diagnosing and finding the cause of peripheral neuropathy can be difficult. Your health care provider will take your  medical history and do a physical exam. A neurological exam will also be done. This involves checking things that are affected by your brain, spinal cord, and nerves (nervous system). For example, your health care provider will check your reflexes, how you move, and what you can feel. You may have other tests, such as: Blood tests. Electromyogram (EMG) and nerve conduction tests. These tests check nerve function and how well the nerves are controlling the muscles. Imaging tests, such as a CT scan or MRI, to rule out other causes of your symptoms. Removing a small piece of nerve to be examined in a lab (nerve biopsy). Removing and examining a small amount of the fluid that surrounds the brain and spinal cord (lumbar puncture). How is this treated? Treatment for this condition may involve: Treating the underlying cause of the neuropathy, such as diabetes, kidney disease, or vitamin deficiencies. Stopping medicines that can cause neuropathy, such as chemotherapy. Medicine to help relieve pain. Medicines may include: Prescription or over-the-counter pain medicine. Anti-seizure medicine. Antidepressants. Pain-relieving patches that are applied to painful areas of skin. Surgery to relieve pressure on a nerve or to destroy a nerve that is causing pain. Physical therapy to help improve movement and balance. Devices to help you move around (assistive devices). Follow these instructions at home: Medicines Take over-the-counter and prescription medicines only as told by your health care provider. Do not take any other medicines without first asking your health care provider. Ask your health care provider if the medicine prescribed to you requires you to avoid driving or using machinery. Lifestyle  Do not use any products that contain nicotine or tobacco. These products include cigarettes, chewing tobacco, and vaping devices, such as e-cigarettes. Smoking keeps   blood from reaching damaged nerves. If you  need help quitting, ask your health care provider. Avoid or limit alcohol. Too much alcohol can cause a vitamin B deficiency, and vitamin B is needed for healthy nerves. Eat a healthy diet. This includes: Eating foods that are high in fiber, such as beans, whole grains, and fresh fruits and vegetables. Limiting foods that are high in fat and processed sugars, such as fried or sweet foods. General instructions  If you have diabetes, work closely with your health care provider to keep your blood sugar under control. If you have numbness in your feet: Check every day for signs of injury or infection. Watch for redness, warmth, and swelling. Wear padded socks and comfortable shoes. These help protect your feet. Develop a good support system. Living with peripheral neuropathy can be stressful. Consider talking with a mental health specialist or joining a support group. Use assistive devices and attend physical therapy as told by your health care provider. This may include using a walker or a cane. Keep all follow-up visits. This is important. Where to find more information National Institute of Neurological Disorders: www.ninds.nih.gov Contact a health care provider if: You have new signs or symptoms of peripheral neuropathy. You are struggling emotionally from dealing with peripheral neuropathy. Your pain is not well controlled. Get help right away if: You have an injury or infection that is not healing normally. You develop new weakness in an arm or leg. You have fallen or do so frequently. Summary Peripheral neuropathy is when the nerves in the arms or legs are damaged, resulting in numbness, weakness, or pain. There are many causes of peripheral neuropathy, including diabetes, pinched nerves, vitamin deficiencies, autoimmune disease, and hereditary conditions. Diagnosing and finding the cause of peripheral neuropathy can be difficult. Your health care provider will take your medical  history, do a physical exam, and do tests, including blood tests and nerve function tests. Treatment involves treating the underlying cause of the neuropathy and taking medicines to help control pain. Physical therapy and assistive devices may also help. This information is not intended to replace advice given to you by your health care provider. Make sure you discuss any questions you have with your health care provider. Document Revised: 02/28/2021 Document Reviewed: 02/28/2021 Elsevier Patient Education  2023 [employer]  

## 2022-10-19 ENCOUNTER — Other Ambulatory Visit: Payer: Self-pay | Admitting: Neurology

## 2022-10-19 DIAGNOSIS — I83893 Varicose veins of bilateral lower extremities with other complications: Secondary | ICD-10-CM

## 2022-10-19 LAB — ANCA PROFILE

## 2022-10-20 LAB — ANCA PROFILE
Atypical pANCA: <1:20 {titer}
C-ANCA: <1:20 {titer}
P-ANCA: <1:20 {titer}

## 2022-10-20 LAB — SJOGREN'S SYNDROME ANTIBODS(SSA + SSB)
ENA SSA (RO) Ab: <0.2 AI (ref 0.0–0.9)
ENA SSB (LA) Ab: <0.2 AI (ref 0.0–0.9)

## 2022-10-20 LAB — LYME DISEASE SEROLOGY W/REFLEX: Lyme Total Antibody EIA: NEGATIVE

## 2022-10-20 LAB — RHEUMATOID FACTOR: Rheumatoid fact SerPl-aCnc: <10 IU/mL (ref <14.0)

## 2022-10-22 NOTE — Progress Notes (Unsigned)
Full Name: Shawn Mccall Gender: Male MRN #: [id number] Date of Birth: 09/21/1950    Visit Date: 10/18/2022 11:24 Age: 72 Years Examining Physician: Dr. Naomie Dean Referring Physician: Dr. Naomie Dean Height: 6 feet 0 inch    History: 72 year old patient with numbness in the feet since 2015 but stable in the last 2-3 years. Stiffness in the toes.  Stiffness and numbness in the balls of the feet. He has the feeling in the arch of his toes. More stiffness than anything. He stopped smoking. He has chronic low back pain but MRI was unremarkable. Feels like feet swollen but they are not. He has cramps in feet, stiffness in toes and ball of the foot, Numbness in balls of the feet, New varicose veins and some hyperpigmentation around the ankles. Serum workup has been unremarkable for etiology.   Summary: nerve conduction studies were performed on the bilateral lower extremities. The right peroneal motor nerve showed decreased conduction velocity (fib head to ankle, 42 m/s, normal greater than 44). The left peroneal motor nerve showed delayed distal peak latency (6.8 ms, normal less than 6.5) and decreased conduction velocity (fib head to ankle, 34 m/s, normal greater than 44).  The right tibial motor nerve showed delayed distal peak latency (9.2 ms, normal less than 5.8), reduced amplitude (1.3 mV, normal greater than 4)  The left tibial motor nerve showed delayed distal peak latency (6.5 ms, normal less than 5.8), reduced amplitude (2.8 mV, normal greater than 4) and decreased conduction velocity (popliteal fossa to ankle, 35 m/s, normal greater than 41).  The right radial sensory nerve was within normal limits.  The right sural sensory nerve showed reduced amplitude (4 V, normal greater than 6).  The left sural sensory nerve showed reduced amplitude (3 V, normal greater than 6).  The right and left superficial peroneal sensory nerves showed no response.  The right tibial F wave showed delayed  latency (64.4 ms, normal less than 56).  The left tibial F wave showed delayed latency (63.7 ms, normal less than 56).All remaining nerves  (as indicated in the following tables) were within normal limits.  All muscles (as indicated in the following tables) were within normal limits.    Conclusion: There is electrophysiologic evidence of a length dependent, axonal, moderately severe, sensorimotor polyneuropathy.   ------------------------------- Dr. Naomie Dean, M.D.  [facility] [address] [place], [place] [address] Tel: [phone] Fax: [phone]  Verbal informed consent was obtained from the patient, patient was informed of potential risk of procedure, including bruising, bleeding, hematoma formation, infection, muscle weakness, muscle pain, numbness, among others.        MNC    Nerve / Sites Muscle Latency Ref. Amplitude Ref. Rel Amp Segments Distance Velocity Ref. Area    ms ms mV mV %  cm m/s m/s mVms  R Peroneal - EDB     Ankle EDB 6.0 ?6.5 2.5 ?2.0 100 Ankle - EDB 9   7.1     Fib head EDB 12.8  2.2  89.5 Fib head - Ankle 28 42 ?44 6.7     Pop fossa EDB 15.5  2.5  115 Pop fossa - Fib head 12 44 ?44 8.2         Pop fossa - Ankle      L Peroneal - EDB     Ankle EDB 6.8 ?6.5 2.5 ?2.0 100 Ankle - EDB 9   6.9  Fib head EDB 15.2  2.3  95.9 Fib head - Ankle 28.6 34 ?44 7.6     Pop fossa EDB 17.3  2.2  94.1 Pop fossa - Fib head 11 53 ?44 7.3         Pop fossa - Ankle      R Tibial - AH     Ankle AH 9.2 ?5.8 1.3 ?4.0 100 Ankle - AH 9   2.6     Pop fossa AH 20.6  1.0  78 Pop fossa - Ankle 43 38 ?41 2.3  L Tibial - AH     Ankle AH 6.5 ?5.8 2.8 ?4.0 100 Ankle - AH 9   6.8     Pop fossa AH 18.7  2.0  71 Pop fossa - Ankle 43 35 ?41 7.6             SNC    Nerve / Sites Rec. Site Peak Lat Ref.  Amp Ref. Segments Distance    ms ms V V  cm  R Radial - Anatomical snuff box (Forearm)     Forearm Wrist 2.2 ?2.9 33 ?15 Forearm - Wrist 10  R Sural -  Ankle (Calf)     Calf Ankle 3.6 ?4.4 4 ?6 Calf - Ankle 14  L Sural - Ankle (Calf)     Calf Ankle 4.1 ?4.4 3 ?6 Calf - Ankle 14  R Superficial peroneal - Ankle     Lat leg Ankle NR ?4.4 NR ?6 Lat leg - Ankle 14  L Superficial peroneal - Ankle     Lat leg Ankle NR ?4.4 NR ?6 Lat leg - Ankle 14               F  Wave    Nerve F Lat Ref.   ms ms  R Tibial - AH 64.4 ?56.0  L Tibial - AH 63.7 ?56.0         EMG Summary Table    Spontaneous MUAP Recruitment  Muscle IA Fib PSW Fasc Other Amp Dur. Poly Pattern  R. Vastus medialis Normal None None None _______ Normal Normal Normal Normal  R. Tibialis anterior Normal None None None _______ Normal Normal Normal Normal  R. Gastrocnemius (Medial head) Normal None None None _______ Normal Normal Normal Normal  R. Extensor hallucis longus Normal None None None _______ Normal Normal Normal Normal  R. Abductor hallucis Normal None None None _______ Normal Normal Normal Normal

## 2022-10-23 ENCOUNTER — Other Ambulatory Visit: Payer: Self-pay | Admitting: *Deleted

## 2022-10-23 DIAGNOSIS — I8393 Asymptomatic varicose veins of bilateral lower extremities: Secondary | ICD-10-CM

## 2022-10-23 NOTE — Procedures (Signed)
Full Name: Shawn Mccall Gender: Male MRN #: [id number] Date of Birth: 26-Apr-1951    Visit Date: 10/18/2022 11:24 Age: 72 Years Examining Physician: Dr. Naomie Dean Referring Physician: Dr. Naomie Dean Height: 6 feet 0 inch    History: 72 year old patient with numbness in the feet since 2015 but stable in the last 2-3 years. Stiffness in the toes.  Stiffness and numbness in the balls of the feet. He has the feeling in the arch of his toes. More stiffness than anything. He stopped smoking. He has chronic low back pain but MRI was unremarkable. Feels like feet swollen but they are not. He has cramps in feet, stiffness in toes and ball of the foot, Numbness in balls of the feet, New varicose veins and some hyperpigmentation around the ankles. Serum workup has been unremarkable for etiology.   Summary: nerve conduction studies were performed on the bilateral lower extremities. The right peroneal motor nerve showed decreased conduction velocity (fib head to ankle, 42 m/s, normal greater than 44). The left peroneal motor nerve showed delayed distal peak latency (6.8 ms, normal less than 6.5) and decreased conduction velocity (fib head to ankle, 34 m/s, normal greater than 44).  The right tibial motor nerve showed delayed distal peak latency (9.2 ms, normal less than 5.8), reduced amplitude (1.3 mV, normal greater than 4)  The left tibial motor nerve showed delayed distal peak latency (6.5 ms, normal less than 5.8), reduced amplitude (2.8 mV, normal greater than 4) and decreased conduction velocity (popliteal fossa to ankle, 35 m/s, normal greater than 41).  The right radial sensory nerve was within normal limits.  The right sural sensory nerve showed reduced amplitude (4 V, normal greater than 6).  The left sural sensory nerve showed reduced amplitude (3 V, normal greater than 6).  The right and left superficial peroneal sensory nerves showed no response.  The right tibial F wave showed delayed  latency (64.4 ms, normal less than 56).  The left tibial F wave showed delayed latency (63.7 ms, normal less than 56).All remaining nerves  (as indicated in the following tables) were within normal limits.  All muscles (as indicated in the following tables) were within normal limits.    Conclusion: There is electrophysiologic evidence of a length dependent, axonal, moderately severe, sensorimotor polyneuropathy.   ------------------------------- Dr. Naomie Dean, M.D.  [facility] [address] [place], [place] [address] Tel: [phone] Fax: [phone]  Verbal informed consent was obtained from the patient, patient was informed of potential risk of procedure, including bruising, bleeding, hematoma formation, infection, muscle weakness, muscle pain, numbness, among others.        MNC    Nerve / Sites Muscle Latency Ref. Amplitude Ref. Rel Amp Segments Distance Velocity Ref. Area    ms ms mV mV %  cm m/s m/s mVms  R Peroneal - EDB     Ankle EDB 6.0 ?6.5 2.5 ?2.0 100 Ankle - EDB 9   7.1     Fib head EDB 12.8  2.2  89.5 Fib head - Ankle 28 42 ?44 6.7     Pop fossa EDB 15.5  2.5  115 Pop fossa - Fib head 12 44 ?44 8.2         Pop fossa - Ankle      L Peroneal - EDB     Ankle EDB 6.8 ?6.5 2.5 ?2.0 100 Ankle - EDB 9   6.9  Fib head EDB 15.2  2.3  95.9 Fib head - Ankle 28.6 34 ?44 7.6     Pop fossa EDB 17.3  2.2  94.1 Pop fossa - Fib head 11 53 ?44 7.3         Pop fossa - Ankle      R Tibial - AH     Ankle AH 9.2 ?5.8 1.3 ?4.0 100 Ankle - AH 9   2.6     Pop fossa AH 20.6  1.0  78 Pop fossa - Ankle 43 38 ?41 2.3  L Tibial - AH     Ankle AH 6.5 ?5.8 2.8 ?4.0 100 Ankle - AH 9   6.8     Pop fossa AH 18.7  2.0  71 Pop fossa - Ankle 43 35 ?41 7.6             SNC    Nerve / Sites Rec. Site Peak Lat Ref.  Amp Ref. Segments Distance    ms ms V V  cm  R Radial - Anatomical snuff box (Forearm)     Forearm Wrist 2.2 ?2.9 33 ?15 Forearm - Wrist 10  R Sural -  Ankle (Calf)     Calf Ankle 3.6 ?4.4 4 ?6 Calf - Ankle 14  L Sural - Ankle (Calf)     Calf Ankle 4.1 ?4.4 3 ?6 Calf - Ankle 14  R Superficial peroneal - Ankle     Lat leg Ankle NR ?4.4 NR ?6 Lat leg - Ankle 14  L Superficial peroneal - Ankle     Lat leg Ankle NR ?4.4 NR ?6 Lat leg - Ankle 14               F  Wave    Nerve F Lat Ref.   ms ms  R Tibial - AH 64.4 ?56.0  L Tibial - AH 63.7 ?56.0         EMG Summary Table    Spontaneous MUAP Recruitment  Muscle IA Fib PSW Fasc Other Amp Dur. Poly Pattern  R. Vastus medialis Normal None None None _______ Normal Normal Normal Normal  R. Tibialis anterior Normal None None None _______ Normal Normal Normal Normal  R. Gastrocnemius (Medial head) Normal None None None _______ Normal Normal Normal Normal  R. Extensor hallucis longus Normal None None None _______ Normal Normal Normal Normal  R. Abductor hallucis Normal None None None _______ Normal Normal Normal Normal

## 2022-10-26 ENCOUNTER — Ambulatory Visit (HOSPITAL_COMMUNITY)
Admission: RE | Admit: 2022-10-26 | Discharge: 2022-10-26 | Disposition: A | Discharge status: Home / Self Care | Payer: Medicare Other | Source: Ambulatory Visit | Attending: Vascular Surgery | Admitting: Vascular Surgery

## 2022-10-26 DIAGNOSIS — I8393 Asymptomatic varicose veins of bilateral lower extremities: Secondary | ICD-10-CM

## 2022-10-31 ENCOUNTER — Ambulatory Visit (INDEPENDENT_AMBULATORY_CARE_PROVIDER_SITE_OTHER): Payer: Medicare Other | Admitting: Physician Assistant

## 2022-10-31 VITALS — BP 141/78 | HR 92 | Temp 97.7°F | Wt 221.0 lb

## 2022-10-31 DIAGNOSIS — G629 Polyneuropathy, unspecified: Secondary | ICD-10-CM | POA: Diagnosis not present

## 2022-10-31 DIAGNOSIS — I872 Venous insufficiency (chronic) (peripheral): Secondary | ICD-10-CM | POA: Diagnosis not present

## 2022-10-31 DIAGNOSIS — I8393 Asymptomatic varicose veins of bilateral lower extremities: Secondary | ICD-10-CM

## 2022-10-31 NOTE — Progress Notes (Signed)
Requested by:  Anson Fret, MD [address] [place],  [place] [address]  Reason for consultation: varicose veins    History of Present Illness   Shawn Mccall is a 72 y.o. ([birth year]) male who presents for evaluation of varicose veins.  He has varicose veins of bilateral lower extremities, however these do not concern him.  He denies any issues with pain at his varicose vein size, pain in his legs, burning, itching, bleeding, or ulcerations.  He denies any previous history of DVT or prior vein procedures.  He endorses some lower extremity swelling after being on his feet for a long period of time.  Most of the swelling is in his lower legs and ankle.  He typically wears compression stockings as needed but is unsure if his current ones are on the right size.  His bigger concern today is addressing the numbness in his feet.  He has a longstanding history of persistent numbness in all of his toes in the balls of his feet.  He states that the numbness is always present, and sometimes alleviated by walking.  He has been seeing a neurologist for this and has undergone nerve conduction studies in bilateral lower extremities.  He has been diagnosed with moderately severe sensorimotor polyneuropathy in bilateral lower extremities.  He denies any rest pain, claudication, nonhealing wounds of the lower extremities.  Past Medical History:  Diagnosis Date   Allergy 1960's   hay fever - mild   AVM (arteriovenous malformation)    Kidney stones    Stroke diagnosed 2016 at [facility]   emergency room visit - mri/cat scan detected it    Past Surgical History:  Procedure Laterality Date   BACK SURGERY  07/09/1996   L5-S1 partial disectomy    INGUINAL HERNIA REPAIR  07/09/1968   LAMINECTOMY     SPINE SURGERY  removal detached disc segment mid 90's   not sure this qualifies as "spine" L5/S1 herniated disc    Social History   Socioeconomic History   Marital status: Single    Spouse  name: Not on file   Number of children: 0   Years of education: Not on file   Highest education level: Not on file  Occupational History   Occupation: Retired  Tobacco Use   Smoking status: Former    Types: E-cigarettes   Smokeless tobacco: Never   Tobacco comments:    Marine scientist. since high school  Vaping Use   Vaping Use: Former  Substance and Sexual Activity   Alcohol use: Yes    Comment: social drinker, light   Drug use: Never   Sexual activity: Not Currently    Birth control/protection: None  Other Topics Concern   Not on file  Social History Narrative   Lives at home by himself.   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   Gym/work out twice a week   Caffeine use: 4 cups per day   Social Determinants of Health   Financial Resource Strain: Low Risk  (06/13/2022)   Overall Financial Resource Strain (CARDIA)    Difficulty of Paying Living Expenses: Not hard at all  Food Insecurity: No Food Insecurity (06/13/2022)   Hunger Vital Sign    Worried About Running Out of Food in the Last Year: Never true    Ran Out of Food in the Last Year: Never true  Transportation Needs: No Transportation Needs (06/13/2022)   PRAPARE - Transportation  Lack of Transportation (Medical): No    Lack of Transportation (Non-Medical): No  Physical Activity: Insufficiently Active (06/13/2022)   Exercise Vital Sign    Days of Exercise per Week: 3 days    Minutes of Exercise per Session: 30 min  Stress: No Stress Concern Present (06/13/2022)   [employer] of Occupational Health - Occupational Stress Questionnaire    Feeling of Stress : Not at all  Social Connections: Socially Isolated (06/13/2022)   Social Connection and Isolation Panel [NHANES]    Frequency of Communication with Friends and Family: More than three times a week    Frequency of Social Gatherings with Friends and Family: More than three times a week    Attends Religious Services: Never    Loss adjuster, chartered or Organizations: No    Attends Banker Meetings: Never    Marital Status: Divorced  Catering manager Violence: Not At Risk (06/13/2022)   Humiliation, Afraid, Rape, and Kick questionnaire    Fear of Current or Ex-Partner: No    Emotionally Abused: No    Physically Abused: No    Sexually Abused: No    Family History  Problem Relation Age of Onset   Cancer Mother    Cancer Father    Neuropathy Neg Hx    Stroke Neg Hx    Colon cancer Neg Hx    Rectal cancer Neg Hx    Stomach cancer Neg Hx    Esophageal cancer Neg Hx     Current Outpatient Medications  Medication Sig Dispense Refill   ASHWAGANDHA PO      aspirin EC 81 MG tablet Take 1 tablet (81 mg total) by mouth daily. 30 tablet 0   BENFOTIAMINE PO      diphenhydrAMINE (BENADRYL) 25 mg capsule Take 25 mg by mouth daily.     Krill Oil (OMEGA-3) 500 MG CAPS Take 1 capsule by mouth daily.     Melatonin 10 MG TABS Take 10 mg by mouth at bedtime.     Multiple Vitamins-Minerals (MULTIVITAMIN ADULT PO) Take 1 tablet by mouth daily.     No current facility-administered medications for this visit.    Allergies  Allergen Reactions   Contrast Media [Iodinated Contrast Media]    Ketamine Anxiety    REVIEW OF SYSTEMS (negative unless checked):   Cardiac:   Chest pain or chest pressure?  Shortness of breath upon activity?  Shortness of breath when lying flat?  Irregular heart rhythm?  Vascular:   Pain in calf, thigh, or hip brought on by walking?  Pain in feet at night that wakes you up from your sleep?  Blood clot in your veins?  Leg swelling?  Pulmonary:   Oxygen at home?  Productive cough?  Wheezing?  Neurologic:   Sudden weakness in arms or legs?  Sudden numbness in arms or legs?  Sudden onset of difficult speaking or slurred speech?  Temporary loss of vision in one eye?  Problems with dizziness?  Gastrointestinal:   Blood in stool?  Vomited  blood?  Genitourinary:   Burning when urinating?  Blood in urine?  Psychiatric:   Major depression  Hematologic:   Bleeding problems?  Problems with blood clotting?  Dermatologic:   Rashes or ulcers?  Constitutional:   Fever or chills?  Ear/Nose/Throat:   Change in hearing?  Nose bleeds?  Sore throat?  Musculoskeletal:   Back pain?  Joint pain?  Muscle pain?   Physical Examination     Vitals:  10/31/22 0906  BP: (!) 141/78  Pulse: 92  Temp: 97.7 F (36.5 C)  TempSrc: Temporal  SpO2: 97%  Weight: 221 lb (100.2 kg)   Body mass index is 29.97 kg/m.  General:  WDWN in NAD; vital signs documented above Gait: Not observed HENT: WNL, normocephalic Pulmonary: normal non-labored breathing  Cardiac: Regular Abdomen: soft, NT, no masses Skin: without rashes Vascular Exam/Pulses: 2+ DP pulses bilaterally Extremities: Small scattered varicose veins of bilateral lower extremities with trace edema at the ankle.  Mild stasis pigmentation bilateral lower extremities.  No ulcerations Musculoskeletal: no muscle wasting or atrophy  Neurologic: A&O X 3; decreased sensation in toes 1 through 5 bilaterally Psychiatric:  The pt has Normal affect.  Non-invasive Vascular Imaging   RLE Venous Insufficiency Duplex (10/26/2022):  Venous Reflux Times  +-----------------------+---------+------+-----------+------------+--------  +  RIGHT                 Reflux NoRefluxReflux TimeDiameter  cmsComments                                  Yes                                    +-----------------------+---------+------+-----------+------------+--------  +  CFV                   no                                               +-----------------------+---------+------+-----------+------------+--------  +  FV prox                no                                                +-----------------------+---------+------+-----------+------------+--------  +  FV mid                 no                                               +-----------------------+---------+------+-----------+------------+--------  +  FV dist                no                                               +-----------------------+---------+------+-----------+------------+--------  +  Popliteal             no                                               +-----------------------+---------+------+-----------+------------+--------  +  GSV at [facility]             no  0.607               +-----------------------+---------+------+-----------+------------+--------  +  GSV prox thigh         no                           0.446               +-----------------------+---------+------+-----------+------------+--------  +  GSV mid thigh          no                           0.451    455 ms     +-----------------------+---------+------+-----------+------------+--------  +  GSV dist thigh         no                           0.488    367 ms     +-----------------------+---------+------+-----------+------------+--------  +  GSV at knee                      yes    >500 ms     0.435               +-----------------------+---------+------+-----------+------------+--------  +  GSV prox calf          no                           0.377    477 ms     +-----------------------+---------+------+-----------+------------+--------  +  SSV Pop Fossa          no                           0.233               +-----------------------+---------+------+-----------+------------+--------  +  SSV prox calf          no                           0.228               +-----------------------+---------+------+-----------+------------+--------  +  SSV mid calf           no                           0.169                +-----------------------+---------+------+-----------+------------+--------  +  VV cluster at prox calfno                                                 Medical Decision Making   ADRICK KESTLER is a 72 y.o. male who presents for evaluation of venous insufficiency  Based on the patient's right lower extremity duplex, there is venous reflux in the right greater saphenous vein at the knee.  The rest of his deep and superficial venous system is competent.  There is no signs of SVT or DVT The patient describes occasional bilateral lower extremity swelling that is usually only present after being on his legs for a while.  The swelling does not bother him. He notes some small varicose veins of his lower legs, but he does not notice any symptoms from them.  He currently wears compression stockings as needed for his leg swelling, however he does not know if these fit him properly His main concern at today's visit was to discuss any possible vascular causes for the numbness in his feet.  He has a multiple year history of moderately severe sensorimotor polyneuropathy in bilateral lower extremities.  He has persistent numbness in all of his toes and the balls of his feet.  He is currently under the care of a neurologist.  He denies any classic symptoms of arterial disease, including claudication, rest pain, nonhealing wounds of the lower extremities.  He also has palpable peripheral pulses The patient has mild venous insufficiency in his right lower extremity.  He has no classic symptoms of arterial disease.  I do not believe that there is any vascular contribution to his polyneuropathy.  I have encouraged him to pursue further treatment options for his neuropathy with his neurologist.  He was also measured for and received proper fitting 15 to 20 mmHg knee-high compression stockings today He can follow-up with our office as needed    Ernestene Mention, PA-C [facility] Office: [phone]  10/31/2022, 1:09 PM  Clinic MD: Randie Heinz

## 2022-11-02 ENCOUNTER — Encounter: Payer: Self-pay | Admitting: Family Medicine

## 2022-12-25 ENCOUNTER — Ambulatory Visit: Payer: Medicare Other

## 2022-12-25 DIAGNOSIS — Z23 Encounter for immunization: Secondary | ICD-10-CM | POA: Diagnosis not present

## 2023-01-14 ENCOUNTER — Ambulatory Visit: Payer: Medicare Other | Admitting: Family Medicine

## 2023-06-17 ENCOUNTER — Ambulatory Visit (INDEPENDENT_AMBULATORY_CARE_PROVIDER_SITE_OTHER): Payer: Medicare Other

## 2023-06-17 VITALS — Ht 72.0 in | Wt 221.0 lb

## 2023-06-17 DIAGNOSIS — Z0000 Encounter for general adult medical examination without abnormal findings: Secondary | ICD-10-CM | POA: Diagnosis not present

## 2023-06-17 NOTE — Patient Instructions (Signed)
Mr. Nash , Thank you for taking time to come for your Medicare Wellness Visit. I appreciate your ongoing commitment to your health goals. Please review the following plan we discussed and let me know if I can assist you in the future.   Referrals/Orders/Follow-Ups/Clinician Recommendations: Aim for 30 minutes of exercise or brisk walking, 6-8 glasses of water, and 5 servings of fruits and vegetables each day.  This is a list of the screening recommended for you and due dates:  Health Maintenance  Topic Date Due   Zoster (Shingles) Vaccine (1 of 2) Never done   COVID-19 Vaccine (5 - 2023-24 season) 03/10/2023   Medicare Annual Wellness Visit  06/16/2024   Colon Cancer Screening  08/09/2024   DTaP/Tdap/Td vaccine (2 - Td or Tdap) 12/24/2032   Pneumonia Vaccine  Completed   Hepatitis C Screening  Completed   HPV Vaccine  Aged Out   Flu Shot  Discontinued    Advanced directives: (ACP Link)Information on Advanced Care Planning can be found at [facility] [employer] Advance Health Care Directives Advance Health Care Directives ([email or web address]   Next Medicare Annual Wellness Visit scheduled for next year: Yes

## 2023-06-17 NOTE — Progress Notes (Signed)
Subjective:   Shawn Mccall is a 72 y.o. male who presents for Medicare Annual/Subsequent preventive examination.  Visit Complete: Virtual I connected with  Aliene Beams on 06/17/23 by a audio enabled telemedicine application and verified that I am speaking with the correct person using two identifiers.  Patient Location: Home  Provider Location: Home Office  I discussed the limitations of evaluation and management by telemedicine. The patient expressed understanding and agreed to proceed.  Vital Signs: Because this visit was a virtual/telehealth visit, some criteria may be missing or patient reported. Any vitals not documented were not able to be obtained and vitals that have been documented are patient reported.  Cardiac Risk Factors include: advanced age (>31men, >65 women);male gender     Objective:    Today's Vitals   06/17/23 0841  Weight: 221 lb (100.2 kg)  Height: 6' (1.829 m)   Body mass index is 29.97 kg/m.     06/17/2023    8:45 AM 06/13/2022    8:28 AM 05/29/2021    8:22 AM 05/05/2015    4:51 PM  Advanced Directives  Does Patient Have a Medical Advance Directive? No No No No  Would patient like information on creating a medical advance directive? Yes (MAU/Ambulatory/Procedural Areas - Information given) No - Patient declined No - Patient declined No - patient declined information    Current Medications (verified) Outpatient Encounter Medications as of 06/17/2023  Medication Sig   ASHWAGANDHA PO    BENFOTIAMINE PO    diphenhydrAMINE (BENADRYL) 25 mg capsule Take 25 mg by mouth daily.   Krill Oil (OMEGA-3) 500 MG CAPS Take 1 capsule by mouth daily.   Melatonin 10 MG TABS Take 10 mg by mouth at bedtime.   Multiple Vitamins-Minerals (MULTIVITAMIN ADULT PO) Take 1 tablet by mouth daily.   aspirin EC 81 MG tablet Take 1 tablet (81 mg total) by mouth daily. (Patient not taking: Reported on 06/17/2023)   No facility-administered encounter medications on file as  of 06/17/2023.    Allergies (verified) Contrast media [iodinated contrast media] and Ketamine   History: Past Medical History:  Diagnosis Date   Allergy 1960's   hay fever - mild   AVM (arteriovenous malformation)    Kidney stones    Stroke [facility]) diagnosed 2016 at [facility]   emergency room visit - mri/cat scan detected it   Past Surgical History:  Procedure Laterality Date   BACK SURGERY  07/09/1996   L5-S1 partial disectomy    INGUINAL HERNIA REPAIR  07/09/1968   LAMINECTOMY     SPINE SURGERY  removal detached disc segment mid 90's   not sure this qualifies as "spine" L5/S1 herniated disc   Family History  Problem Relation Age of Onset   Cancer Mother    Cancer Father    Neuropathy Neg Hx    Stroke Neg Hx    Colon cancer Neg Hx    Rectal cancer Neg Hx    Stomach cancer Neg Hx    Esophageal cancer Neg Hx    Social History   Socioeconomic History   Marital status: Single    Spouse name: Not on file   Number of children: 0   Years of education: Not on file   Highest education level: Not on file  Occupational History   Occupation: Retired  Tobacco Use   Smoking status: Former    Types: E-cigarettes   Smokeless tobacco: Never   Tobacco comments:    Marine scientist. since high  school  Vaping Use   Vaping status: Former  Substance and Sexual Activity   Alcohol use: Yes    Comment: social drinker, light   Drug use: Never   Sexual activity: Not Currently    Birth control/protection: None  Other Topics Concern   Not on file  Social History Narrative   Lives at home by himself.   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   Gym/work out twice a week   Caffeine use: 4 cups per day   Social Determinants of Health   Financial Resource Strain: Low Risk  (06/17/2023)   Overall Financial Resource Strain (CARDIA)    Difficulty of Paying Living Expenses: Not hard at all  Food Insecurity: No Food Insecurity (06/17/2023)   Hunger Vital Sign     Worried About Running Out of Food in the Last Year: Never true    Ran Out of Food in the Last Year: Never true  Transportation Needs: No Transportation Needs (06/17/2023)   PRAPARE - Administrator, Civil Service (Medical): No    Lack of Transportation (Non-Medical): No  Physical Activity: Sufficiently Active (06/17/2023)   Exercise Vital Sign    Days of Exercise per Week: 5 days    Minutes of Exercise per Session: 30 min  Stress: No Stress Concern Present (06/17/2023)   [employer] of Occupational Health - Occupational Stress Questionnaire    Feeling of Stress : Not at all  Social Connections: Socially Isolated (06/17/2023)   Social Connection and Isolation Panel [NHANES]    Frequency of Communication with Friends and Family: More than three times a week    Frequency of Social Gatherings with Friends and Family: Three times a week    Attends Religious Services: Never    Active Member of Clubs or Organizations: No    Attends Engineer, structural: Never    Marital Status: Divorced    Tobacco Counseling Counseling given: Not Answered Tobacco comments: quit/start/quit/start etc. since high school   Clinical Intake:  Pre-visit preparation completed: Yes  Pain : No/denies pain     Diabetes: No  How often do you need to have someone help you when you read instructions, pamphlets, or other written materials from your doctor or pharmacy?: 1 - Never  Interpreter Needed?: No  Information entered by :: Kandis Fantasia LPN   Activities of Daily Living    06/17/2023    8:42 AM  In your present state of health, do you have any difficulty performing the following activities:  Hearing? 0  Vision? 0  Difficulty concentrating or making decisions? 0  Walking or climbing stairs? 0  Dressing or bathing? 0  Doing errands, shopping? 0  Preparing Food and eating ? N  Using the Toilet? N  In the past six months, have you accidently leaked urine? N  Do you have  problems with loss of bowel control? N  Managing your Medications? N  Managing your Finances? N  Housekeeping or managing your Housekeeping? N    Patient Care Team: Mechele Claude, MD as PCP - General (Family Medicine) Anson Fret, MD (Neurology) Ernestene Mention, PA-C as Physician Assistant (Physician Assistant)  Indicate any recent Medical Services you may have received from other than [facility] providers in the past year (date may be approximate).     Assessment:   This is a routine wellness examination for Barnabas.  Hearing/Vision screen Hearing Screening - Comments:: Denies hearing difficulties   Vision Screening - Comments::  No vision problems; will schedule routine eye exam soon     Goals Addressed             This Visit's Progress    Remain active and independent        Depression Screen    06/17/2023    8:44 AM 06/13/2022    8:24 AM 05/29/2021    8:41 AM 05/29/2021    8:25 AM 12/01/2020    8:34 AM 12/02/2019    1:49 PM 05/20/2018   12:56 PM  PHQ 2/9 Scores  PHQ - 2 Score 0 0 0 0 0 0 0    Fall Risk    06/17/2023    8:45 AM 06/13/2022    8:23 AM 05/29/2021    8:43 AM 05/29/2021    7:47 AM 12/01/2020    8:34 AM  Fall Risk   Falls in the past year? 0 0 0 0 0  Number falls in past yr: 0 0 0 0   Injury with Fall? 0 0 0 0   Risk for fall due to : No Fall Risks No Fall Risks Other (Comment) Other (Comment)   Risk for fall due to: Comment   hx of cerebellar stroke, intermittent dizziness intermittent dizziness, hx of cerebellar stroke   Follow up Falls prevention discussed;Education provided;Falls evaluation completed Falls prevention discussed Falls prevention discussed Falls prevention discussed     MEDICARE RISK AT HOME: Medicare Risk at Home Any stairs in or around the home?: No If so, are there any without handrails?: No Home free of loose throw rugs in walkways, pet beds, electrical cords, etc?: Yes Adequate lighting in your home to reduce risk of  falls?: Yes Life alert?: No Use of a cane, walker or w/c?: No Grab bars in the bathroom?: Yes Shower chair or bench in shower?: No Elevated toilet seat or a handicapped toilet?: Yes  TIMED UP AND GO:  Was the test performed?  No    Cognitive Function:        06/17/2023    8:45 AM 06/13/2022    8:27 AM 05/29/2021    8:29 AM  6CIT Screen  What Year? 0 points 0 points 0 points  What month? 0 points 0 points 0 points  What time? 0 points 0 points 0 points  Count back from 20 0 points 0 points 0 points  Months in reverse 0 points 0 points 0 points  Repeat phrase 0 points 0 points 0 points  Total Score 0 points 0 points 0 points    Immunizations Immunization History  Administered Date(s) Administered   PFIZER(Purple Top)SARS-COV-2 Vaccination 09/04/2019, 09/29/2019, 04/18/2020   PNEUMOCOCCAL CONJUGATE-20 12/25/2022   Pfizer Covid-19 Vaccine Bivalent Booster 66yrs & up 05/04/2021   Tdap 12/25/2022    TDAP status: Up to date  Flu Vaccine status: Declined, Education has been provided regarding the importance of this vaccine but patient still declined. Advised may receive this vaccine at local pharmacy or Health Dept. Aware to provide a copy of the vaccination record if obtained from local pharmacy or Health Dept. Verbalized acceptance and understanding.  Pneumococcal vaccine status: Up to date  Covid-19 vaccine status: Information provided on how to obtain vaccines.   Qualifies for Shingles Vaccine? Yes   Zostavax completed No   Shingrix Completed?: No.    Education has been provided regarding the importance of this vaccine. Patient has been advised to call insurance company to determine out of pocket expense if they have not yet received this  vaccine. Advised may also receive vaccine at local pharmacy or Health Dept. Verbalized acceptance and understanding.  Screening Tests Health Maintenance  Topic Date Due   Zoster Vaccines- Shingrix (1 of 2) Never done   COVID-19  Vaccine (5 - 2023-24 season) 03/10/2023   Medicare Annual Wellness (AWV)  06/16/2024   Colonoscopy  08/09/2024   DTaP/Tdap/Td (2 - Td or Tdap) 12/24/2032   Pneumonia Vaccine 48+ Years old  Completed   Hepatitis C Screening  Completed   HPV VACCINES  Aged Out   INFLUENZA VACCINE  Discontinued    Health Maintenance  Health Maintenance Due  Topic Date Due   Zoster Vaccines- Shingrix (1 of 2) Never done   COVID-19 Vaccine (5 - 2023-24 season) 03/10/2023    Colorectal cancer screening: Type of screening: Colonoscopy. Completed 08/09/21. Repeat every 3 years  Lung Cancer Screening: (Low Dose CT Chest recommended if Age 61-80 years, 20 pack-year currently smoking OR have quit w/in 15years.) does not qualify.   Lung Cancer Screening Referral: n/a  Additional Screening:  Hepatitis C Screening: does qualify; Completed 12/02/20  Vision Screening: Recommended annual ophthalmology exams for early detection of glaucoma and other disorders of the eye. Is the patient up to date with their annual eye exam?  No  Who is the provider or what is the name of the office in which the patient attends annual eye exams? none If pt is not established with a provider, would they like to be referred to a provider to establish care? No .   Dental Screening: Recommended annual dental exams for proper oral hygiene  Community Resource Referral / Chronic Care Management: CRR required this visit?  No   CCM required this visit?  No     Plan:     I have personally reviewed and noted the following in the patient's chart:   Medical and social history Use of alcohol, tobacco or illicit drugs  Current medications and supplements including opioid prescriptions. Patient is not currently taking opioid prescriptions. Functional ability and status Nutritional status Physical activity Advanced directives List of other physicians Hospitalizations, surgeries, and ER visits in previous 12 months Vitals Screenings  to include cognitive, depression, and falls Referrals and appointments  In addition, I have reviewed and discussed with patient certain preventive protocols, quality metrics, and best practice recommendations. A written personalized care plan for preventive services as well as general preventive health recommendations were provided to patient.     Kandis Fantasia [place], [place]   16/07/958   After Visit Summary: (MyChart) Due to this being a telephonic visit, the after visit summary with patients personalized plan was offered to patient via MyChart   Nurse Notes: No concerns at this time

## 2023-06-28 ENCOUNTER — Encounter: Payer: Self-pay | Admitting: Family Medicine

## 2023-06-30 NOTE — Telephone Encounter (Signed)
Set up office visit

## 2023-07-01 NOTE — Telephone Encounter (Signed)
Apt scheduled.  

## 2023-07-16 ENCOUNTER — Ambulatory Visit: Payer: Medicare Other | Admitting: Family Medicine

## 2023-07-16 VITALS — BP 132/74 | HR 97 | Temp 97.7°F | Ht 73.0 in | Wt 223.2 lb

## 2023-07-16 DIAGNOSIS — D485 Neoplasm of uncertain behavior of skin: Secondary | ICD-10-CM | POA: Diagnosis not present

## 2023-07-16 NOTE — Progress Notes (Signed)
 Chief Complaint  Patient presents with   Nevus    Around bilateral ears and neck. Patient would like them looked at to see if he needs referral.     HPI  Patient presents today for multiple moles around the face and shoulders. None have changes recently, but pt. Is concerned for them because his brother was recently dx with skin Ca.  PMH: Smoking status noted ROS: Per HPI  Objective: BP 132/74   Pulse 97   Temp 97.7 F (36.5 C)   Ht 6' 1 (1.854 m)   Wt 223 lb 3.2 oz (101.2 kg)   SpO2 94%   BMI 29.45 kg/m  Gen: NAD, alert, cooperative with exam HEENT: NCAT, EOMI, PERRL CV: RRR, good S1/S2, no murmur Resp: CTABL, no wheezes, non-labored Skin: multiple moles, on face, temples, scalp that are well demarkated, homogeneous in color. Brow to  red with no stigmata of Ca, particularly melanoma. Around neck, shoulders and axillae there are TNTC skin tags 2-4 mm.  Ext: No edema, warm Neuro: Alert and oriented, No gross deficits  Assessment and plan:  1. Neoplasm of uncertain behavior of skin     No orders of the defined types were placed in this encounter.   Orders Placed This Encounter  Procedures   Ambulatory referral to Dermatology    Referral Priority:   Routine    Referral Type:   Consultation    Referral Reason:   Specialty Services Required    Requested Specialty:   Dermatology    Number of Visits Requested:   1    Follow up soon for CPE  Butler Der, MD

## 2023-07-23 ENCOUNTER — Ambulatory Visit: Payer: Medicare Other | Admitting: Family Medicine

## 2023-08-21 ENCOUNTER — Ambulatory Visit (INDEPENDENT_AMBULATORY_CARE_PROVIDER_SITE_OTHER): Payer: Medicare Other | Admitting: Family Medicine

## 2023-08-21 ENCOUNTER — Encounter: Payer: Self-pay | Admitting: Family Medicine

## 2023-08-21 VITALS — BP 159/79 | HR 76 | Wt 224.0 lb

## 2023-08-21 DIAGNOSIS — G5793 Unspecified mononeuropathy of bilateral lower limbs: Secondary | ICD-10-CM

## 2023-08-21 DIAGNOSIS — N400 Enlarged prostate without lower urinary tract symptoms: Secondary | ICD-10-CM

## 2023-08-21 DIAGNOSIS — Z125 Encounter for screening for malignant neoplasm of prostate: Secondary | ICD-10-CM

## 2023-08-21 DIAGNOSIS — G8929 Other chronic pain: Secondary | ICD-10-CM

## 2023-08-21 DIAGNOSIS — I6930 Unspecified sequelae of cerebral infarction: Secondary | ICD-10-CM | POA: Diagnosis not present

## 2023-08-21 DIAGNOSIS — Z122 Encounter for screening for malignant neoplasm of respiratory organs: Secondary | ICD-10-CM

## 2023-08-21 DIAGNOSIS — Z87891 Personal history of nicotine dependence: Secondary | ICD-10-CM | POA: Diagnosis not present

## 2023-08-21 DIAGNOSIS — M545 Low back pain, unspecified: Secondary | ICD-10-CM | POA: Diagnosis not present

## 2023-08-21 DIAGNOSIS — Z0000 Encounter for general adult medical examination without abnormal findings: Secondary | ICD-10-CM

## 2023-08-21 DIAGNOSIS — I639 Cerebral infarction, unspecified: Secondary | ICD-10-CM

## 2023-08-21 MED ORDER — PREGABALIN 50 MG PO CAPS: ORAL_CAPSULE | ORAL | 0 refills | Status: DC

## 2023-08-21 NOTE — Progress Notes (Signed)
 Subjective:  Patient ID: Shawn Mccall, male    DOB: [birth year]  Age: 73 y.o. MRN: [id number]  CC: Annual Exam (No concerns at this time. )  a HPI Shawn Mccall presents for eval of foot pain, BPH with LUTS and old CVA. He has constant pain in the lower legs and feet.  No residua from the stroke noted currently.      07/16/2023    9:09 AM 06/17/2023    8:44 AM 06/13/2022    8:24 AM  Depression screen PHQ 2/9  Decreased Interest 0 0 0  Down, Depressed, Hopeless 0 0 0  PHQ - 2 Score 0 0 0  Altered sleeping 0    Tired, decreased energy 1    Change in appetite 0    Feeling bad or failure about yourself  0    Trouble concentrating 0    Moving slowly or fidgety/restless 0    Suicidal thoughts 0    PHQ-9 Score 1    Difficult doing work/chores Not difficult at all      History Shawn Mccall has a past medical history of Allergy (1960's), AVM (arteriovenous malformation), Kidney stones, and Stroke [facility]) (diagnosed [facility] at [facility]).   He has a past surgical history that includes Back surgery (07/09/1996); Inguinal hernia repair (07/09/1968); Spine surgery (removal detached disc segment mid 90's); and Laminectomy.   His family history includes Cancer in his father and mother.He reports that he has quit smoking. His smoking use included e-cigarettes. He has never used smokeless tobacco. He reports current alcohol use. He reports that he does not use drugs.    ROS Review of Systems  Constitutional:  Negative for activity change, fatigue and unexpected weight change.  HENT:  Positive for tinnitus (occasional). Negative for congestion, ear pain, hearing loss, postnasal drip and trouble swallowing.   Eyes:  Negative for pain and visual disturbance.  Respiratory:  Negative for cough, chest tightness and shortness of breath.   Cardiovascular:  Negative for chest pain, palpitations and leg swelling.  Gastrointestinal:  Negative for abdominal distention, abdominal pain, blood in stool,  constipation, diarrhea, nausea and vomiting.  Endocrine: Negative for cold intolerance, heat intolerance and polydipsia.  Genitourinary:  Positive for frequency. Negative for difficulty urinating, dysuria, flank pain and urgency.  Musculoskeletal:  Positive for arthralgias (left knee pain). Negative for joint swelling.  Skin:  Negative for color change, rash and wound.  Neurological:  Negative for dizziness, syncope, speech difficulty, weakness, light-headedness, numbness and headaches.       Neuropathy of feet. Has pain  Hematological:  Does not bruise/bleed easily.  Psychiatric/Behavioral:  Negative for confusion, decreased concentration, dysphoric mood and sleep disturbance. The patient is not nervous/anxious.     Objective:  BP (!) 159/79   Pulse 76   Wt 224 lb (101.6 kg)   SpO2 96%   BMI 29.55 kg/m   BP Readings from Last 3 Encounters:  08/21/23 (!) 159/79  07/16/23 132/74  10/31/22 (!) 141/78    Wt Readings from Last 3 Encounters:  08/21/23 224 lb (101.6 kg)  07/16/23 223 lb 3.2 oz (101.2 kg)  06/17/23 221 lb (100.2 kg)     Physical Exam Constitutional:      Appearance: He is well-developed.  HENT:     Head: Normocephalic and atraumatic.  Eyes:     Pupils: Pupils are equal, round, and reactive to light.  Neck:     Thyroid: No thyromegaly.     Trachea: No tracheal deviation.  Cardiovascular:     Rate and Rhythm: Normal rate and regular rhythm.     Heart sounds: Normal heart sounds. No murmur heard.    No friction rub. No gallop.  Pulmonary:     Breath sounds: Normal breath sounds. No wheezing or rales.  Abdominal:     General: Bowel sounds are normal. There is no distension.     Palpations: Abdomen is soft. There is no mass.     Tenderness: There is no abdominal tenderness.     Hernia: There is no hernia in the left inguinal area.  Genitourinary:    Penis: Normal.      Testes: Normal.  Musculoskeletal:        General: Normal range of motion.     Cervical  back: Normal range of motion.  Lymphadenopathy:     Cervical: No cervical adenopathy.  Skin:    General: Skin is warm and dry.  Neurological:     Mental Status: He is alert and oriented to person, place, and time.       Assessment & Plan:   Shawn Mccall was seen today for annual exam.  Diagnoses and all orders for this visit:  Neuropathy of both feet  Cerebellar stroke (HCC) -     CBC with Differential/Platelet -     CMP14+EGFR -     Lipid panel  Encounter for screening for malignant neoplasm of lung in former smoker who quit in past 15 years with 30 pack year history or greater -     Ambulatory Referral for Lung Cancer Scre  Screening for prostate cancer  Benign prostatic hyperplasia without lower urinary tract symptoms -     PSA, total and free  Chronic midline low back pain without sciatica  Other orders -     pregabalin (LYRICA) 50 MG capsule; 1 qhs X7 days , then 2 qhs X 7d, then 3 qhs X 7d, then 4 qhs       I am having Shawn Mccall start on pregabalin. I am also having him maintain his Multiple Vitamins-Minerals (MULTIVITAMIN ADULT PO), aspirin EC, Melatonin, diphenhydrAMINE, Omega-3, BENFOTIAMINE PO, and ASHWAGANDHA PO.  Allergies as of 08/21/2023       Reactions   Contrast Media [iodinated Contrast Media]    Ketamine Anxiety        Medication List        Accurate as of August 21, 2023 11:59 PM. If you have any questions, ask your nurse or doctor.          ASHWAGANDHA PO   aspirin EC 81 MG tablet Take 1 tablet (81 mg total) by mouth daily.   BENFOTIAMINE PO   diphenhydrAMINE 25 mg capsule Commonly known as: BENADRYL Take 25 mg by mouth daily.   Melatonin 10 MG Tabs Take 10 mg by mouth at bedtime.   MULTIVITAMIN ADULT PO Take 1 tablet by mouth daily.   Omega-3 500 MG Caps Take 1 capsule by mouth daily.   pregabalin 50 MG capsule Commonly known as: Lyrica 1 qhs X7 days , then 2 qhs X 7d, then 3 qhs X 7d, then 4 qhs Started by:  Shawn Mccall         Follow-up: Return in about 1 month (around 09/18/2023).  Mechele Claude, M.D.

## 2023-08-22 ENCOUNTER — Other Ambulatory Visit: Payer: Medicare Other

## 2023-08-22 DIAGNOSIS — I639 Cerebral infarction, unspecified: Secondary | ICD-10-CM | POA: Diagnosis not present

## 2023-08-22 DIAGNOSIS — N400 Enlarged prostate without lower urinary tract symptoms: Secondary | ICD-10-CM | POA: Diagnosis not present

## 2023-08-22 LAB — LIPID PANEL

## 2023-08-23 ENCOUNTER — Encounter: Payer: Self-pay | Admitting: Family Medicine

## 2023-08-23 LAB — CMP14+EGFR
ALT: 18 IU/L (ref 0–44)
AST: 19 IU/L (ref 0–40)
Albumin: 4.1 g/dL (ref 3.8–4.8)
Alkaline Phosphatase: 71 IU/L (ref 44–121)
BUN/Creatinine Ratio: 13 (ref 10–24)
BUN: 14 mg/dL (ref 8–27)
Bilirubin Total: 0.5 mg/dL (ref 0.0–1.2)
CO2: 21 mmol/L (ref 20–29)
Calcium: 9.7 mg/dL (ref 8.6–10.2)
Chloride: 106 mmol/L (ref 96–106)
Creatinine, Ser: 1.1 mg/dL (ref 0.76–1.27)
Globulin, Total: 2.3 g/dL (ref 1.5–4.5)
Glucose: 96 mg/dL (ref 70–99)
Potassium: 4.8 mmol/L (ref 3.5–5.2)
Sodium: 143 mmol/L (ref 134–144)
Total Protein: 6.4 g/dL (ref 6.0–8.5)
eGFR: 70 mL/min/{1.73_m2} (ref >59)

## 2023-08-23 LAB — CBC WITH DIFFERENTIAL/PLATELET
Basophils Absolute: 0 10*3/uL (ref 0.0–0.2)
Basos: 1 %
EOS (ABSOLUTE): 0.1 10*3/uL (ref 0.0–0.4)
Eos: 2 %
Hematocrit: 51 % (ref 37.5–51.0)
Hemoglobin: 16.8 g/dL (ref 13.0–17.7)
Immature Grans (Abs): 0 10*3/uL (ref 0.0–0.1)
Immature Granulocytes: 0 %
Lymphocytes Absolute: 1.5 10*3/uL (ref 0.7–3.1)
Lymphs: 25 %
MCH: 28 pg (ref 26.6–33.0)
MCHC: 32.9 g/dL (ref 31.5–35.7)
MCV: 85 fL (ref 79–97)
Monocytes Absolute: 0.6 10*3/uL (ref 0.1–0.9)
Monocytes: 10 %
Neutrophils Absolute: 3.6 10*3/uL (ref 1.4–7.0)
Neutrophils: 62 %
Platelets: 227 10*3/uL (ref 150–450)
RBC: 6.01 x10E6/uL — ABNORMAL HIGH (ref 4.14–5.80)
RDW: 12.3 % (ref 11.6–15.4)
WBC: 5.9 10*3/uL (ref 3.4–10.8)

## 2023-08-23 LAB — LIPID PANEL
Cholesterol, Total: 148 mg/dL (ref 100–199)
HDL: 34 mg/dL — ABNORMAL LOW (ref >39)
LDL CALC COMMENT:: 4.4 ratio (ref 0.0–5.0)
LDL Chol Calc (NIH): 97 mg/dL (ref 0–99)
Triglycerides: 92 mg/dL (ref 0–149)
VLDL Cholesterol Cal: 17 mg/dL (ref 5–40)

## 2023-08-23 LAB — PSA, TOTAL AND FREE
PSA, Free Pct: 29.1 %
PSA, Free: 0.32 ng/mL
Prostate Specific Ag, Serum: 1.1 ng/mL (ref 0.0–4.0)

## 2023-08-23 NOTE — Progress Notes (Signed)
Hello Clance,  Your lab result is normal and/or stable.Some minor variations that are not significant are commonly marked abnormal, but do not represent any medical problem for you.  Best regards, Mechele Claude, M.D.

## 2023-08-29 NOTE — Addendum Note (Signed)
 Addended by: Mechele Claude on: 08/29/2023 02:52 PM   Modules accepted: Level of Service

## 2023-09-05 ENCOUNTER — Telehealth: Payer: Medicare Other | Admitting: Physician Assistant

## 2023-09-05 DIAGNOSIS — W57XXXA Bitten or stung by nonvenomous insect and other nonvenomous arthropods, initial encounter: Secondary | ICD-10-CM | POA: Diagnosis not present

## 2023-09-05 DIAGNOSIS — Z792 Long term (current) use of antibiotics: Secondary | ICD-10-CM | POA: Diagnosis not present

## 2023-09-05 MED ORDER — DOXYCYCLINE HYCLATE 100 MG PO TABS: 200.0000 mg | ORAL_TABLET | Freq: Once | ORAL | 0 refills | Status: AC

## 2023-09-05 NOTE — Progress Notes (Signed)
 I have spent 5 minutes in review of e-visit questionnaire, review and updating patient chart, medical decision making and response to patient.   Piedad Climes, PA-C

## 2023-09-05 NOTE — Progress Notes (Signed)
 E-Visit for Tick Bite  Thank you for describing your tick bite, Here is how we plan to help! Based on the information that you shared with me it looks like you have An uncomplicated tick bite that just occurred and can be closely follow using the instructions in your care plan.  In most cases a tick bite is painless and does not itch.  Most tick bites in which the tick is quickly removed do not require prescriptions. Ticks can transmit several diseases if they are infected and remain attacked to your skin. Therefore the length that the tick was attached and any symptoms you have experienced after the bite are import to accurately develop your custom treatment plan. In most cases a single dose of doxycycline may prevent the development of a more serious condition.  Based on your information I have Provided a home care guide for tick bites and  instructions on when to call for help. and I have sent a single dose of doxycycline (prophylactic) to the pharmacy you selected. Please make sure that you selected a pharmacy that is open now.  Which ticks  are associated with illness?  The Wood Tick (dog tick) is the size of a watermelon seed and can sometimes transmit [facility] spotted fever and [place] tick fever.   The Deer Tick (black-legged tick) is between the size of a poppy seed (pin head) and an apple seed, and can sometimes transmit Lyme disease.  A brown to black tick with a white splotch on its back is likely a male Amblyomma americanum ([place] tick). This tick has been associated with Southern Tick Associated illness ( STARI)  Lyme disease has become the most common tick-borne illness in the [place]. The risk of Lyme disease following a recognized deer tick bite is estimated to be 1%.  The majority of cases of Lyme disease start with a bull's eye rash at the site of the tick bite. The rash can occur days to weeks (typically 7-10 days) after a tick bite. Treatment with  antibiotics is indicated if this rash appears. Flu-like symptoms may accompany the rash, including: fever, chills, headaches, muscle aches, and fatigue. Removing ticks promptly may prevent tick borne disease.  What can be used to prevent Tick Bites?  Insect repellant with at leas 20% DEET. Wearing long pants with sock and shoes. Avoiding tall grass and heavily wooded areas. Checking your skin after being outdoors. Shower with a washcloth after outdoor exposures.  HOME CARE ADVICE FOR TICK BITE  Wood Tick Removal:  Use a pair of tweezers and grasp the wood tick close to the skin (on its head). Pull the wood tick straight upward without twisting or crushing it. Maintain a steady pressure until it releases its grip.   If tweezers aren't available, use fingers, a loop of thread around the jaws, or a needle between the jaws for traction.  Note: covering the tick with petroleum jelly, nail polish or rubbing alcohol doesn't work. Neither does touching the tick with a hot or cold object. Tiny Deer Tick Removal:   Needs to be scraped off with a knife blade or credit card edge. Place tick in a sealed container (e.g. glass jar, zip lock plastic bag), in case your doctor wants to see it. Tick's Head Removal:  If the wood tick's head breaks off in the skin, it must be removed. Clean the skin. Then use a sterile needle to uncover the head and lift it out or scrape it off.  If a very small piece of the head remains, the skin will eventually slough it off. Antibiotic Ointment:  Wash the wound and your hands with soap and water after removal to prevent catching any tick disease.  Apply an over the counter antibiotic ointment (e.g. bacitracin) to the bite once. Expected Course: Tick bites normally don't itch or hurt. That's why they often go unnoticed. Call Your Doctor If:  You can't remove the tick or the tick's head Fever, a severe head ache, or rash occur in the next 2 weeks Bite begins to look  infected Lyme's disease is common in your area You have not had a tetanus in the last 10 years Your current symptoms become worse    MAKE SURE YOU  Understand these instructions. Will watch your condition. Will get help right away if you are not doing well or get worse.    Thank you for choosing an e-visit.  Your e-visit answers were reviewed by a board certified advanced clinical practitioner to complete your personal care plan. Depending upon the condition, your plan could have included both over the counter or prescription medications.  Please review your pharmacy choice. Make sure the pharmacy is open so you can pick up prescription now. If there is a problem, you may contact your provider through [employer] and have the prescription routed to another pharmacy.  Your safety is important to [place]. If you have drug allergies check your prescription carefully.   For the next 24 hours you can use MyChart to ask questions about today's visit, request a non-urgent call back, or ask for a work or school excuse. You will get an email in the next two days asking about your experience. I hope that your e-visit has been valuable and will speed your recovery.

## 2023-09-09 ENCOUNTER — Other Ambulatory Visit: Payer: Self-pay | Admitting: *Deleted

## 2023-09-09 ENCOUNTER — Telehealth: Payer: Self-pay | Admitting: *Deleted

## 2023-09-09 DIAGNOSIS — Z87891 Personal history of nicotine dependence: Secondary | ICD-10-CM

## 2023-09-09 DIAGNOSIS — Z122 Encounter for screening for malignant neoplasm of respiratory organs: Secondary | ICD-10-CM

## 2023-09-09 NOTE — Telephone Encounter (Signed)
 Lung Cancer Screening Narrative/Criteria Questionnaire (Cigarette Smokers Only- No Cigars/Pipes/vapes)   Shawn Mccall   SDMV:09/23/23 9:30- [place]                                           [birth year]              LDCT: 09/29/23 3:00-AP    72 y.o.   Phone: [phone]  Lung Screening Narrative (confirm age 14-77 yrs Medicare / 50-80 yrs Private pay insurance)   Insurance information:MCR   Referring Provider:[facility]   This screening involves an initial phone call with a team member from our program. It is called a shared decision making visit. The initial meeting is required by insurance and Medicare to make sure you understand the program. This appointment takes about 15-20 minutes to complete. The CT scan will completed at a separate date/time. This scan takes about 5-10 minutes to complete and you may eat and drink before and after the scan.  Criteria questions for Lung Cancer Screening:   Are you a current or former smoker? Former Age began smoking: 16   If you are a former smoker, what year did you quit smoking? 2021(within 15 yrs)   To calculate your smoking history, I need an accurate estimate of how many packs of cigarettes you smoked per day and for how many years. (Not just the number of PPD you are now smoking)   Years smoking 42 x Packs per day 1 = Pack years 42   (at least 20 pack yrs)   (Make sure they understand that we need to know how much they have smoked in the past, not just the number of PPD they are smoking now)  Do you have a personal history of cancer?  No    Do you have a family history of cancer? Yes  (cancer type and and relative) mother (gallbladder)  Are you coughing up blood?  No  Have you had unexplained weight loss of 15 lbs or more in the last 6 months? No  It looks like you meet all criteria.     Additional information: N/A

## 2023-09-12 ENCOUNTER — Encounter: Payer: Self-pay | Admitting: Family Medicine

## 2023-09-16 ENCOUNTER — Other Ambulatory Visit: Payer: Self-pay | Admitting: Family Medicine

## 2023-09-16 MED ORDER — PREGABALIN 200 MG PO CAPS: 200.0000 mg | ORAL_CAPSULE | Freq: Every day | ORAL | 0 refills | Status: DC

## 2023-09-16 NOTE — Telephone Encounter (Signed)
 I will send in a one week supply of the medication. It will be in the form of a single pill that equals the strength of all four you currently are taking.

## 2023-09-23 ENCOUNTER — Ambulatory Visit (INDEPENDENT_AMBULATORY_CARE_PROVIDER_SITE_OTHER): Payer: Medicare Other | Admitting: Family Medicine

## 2023-09-23 ENCOUNTER — Encounter: Payer: Self-pay | Admitting: Adult Health

## 2023-09-23 ENCOUNTER — Ambulatory Visit: Admitting: Adult Health

## 2023-09-23 ENCOUNTER — Encounter: Payer: Self-pay | Admitting: Family Medicine

## 2023-09-23 VITALS — BP 130/75 | HR 87 | Temp 98.0°F | Ht 73.0 in | Wt 226.0 lb

## 2023-09-23 DIAGNOSIS — R2689 Other abnormalities of gait and mobility: Secondary | ICD-10-CM | POA: Diagnosis not present

## 2023-09-23 DIAGNOSIS — G609 Hereditary and idiopathic neuropathy, unspecified: Secondary | ICD-10-CM

## 2023-09-23 DIAGNOSIS — Z87891 Personal history of nicotine dependence: Secondary | ICD-10-CM

## 2023-09-23 MED ORDER — PREGABALIN 300 MG PO CAPS: 300.0000 mg | ORAL_CAPSULE | Freq: Every day | ORAL | 0 refills | Status: DC

## 2023-09-23 NOTE — Progress Notes (Signed)
 Subjective:  Patient ID: Shawn Mccall, male    DOB: [birth year]  Age: 73 y.o. MRN: [id number]  CC: Medical Management of Chronic Issues   HPI Shawn Mccall presents for pregabalin having a positive effect. Still having cramp and numbness. Equal in both feet. Discomfort decreased 10-25%.      07/16/2023    9:09 AM 06/17/2023    8:44 AM 06/13/2022    8:24 AM  Depression screen PHQ 2/9  Decreased Interest 0 0 0  Down, Depressed, Hopeless 0 0 0  PHQ - 2 Score 0 0 0  Altered sleeping 0    Tired, decreased energy 1    Change in appetite 0    Feeling bad or failure about yourself  0    Trouble concentrating 0    Moving slowly or fidgety/restless 0    Suicidal thoughts 0    PHQ-9 Score 1    Difficult doing work/chores Not difficult at all      History Shawn Mccall has a past medical history of Allergy (1960's), AVM (arteriovenous malformation), Kidney stones, and Stroke [facility]) (diagnosed 2016 at [facility]).   Shawn Mccall has a past surgical history that includes Back surgery (07/09/1996); Inguinal hernia repair (07/09/1968); Spine surgery (removal detached disc segment mid 90's); and Laminectomy.   Shawn Mccall family history includes Cancer in Shawn Mccall father and mother.Shawn Mccall reports that Shawn Mccall has quit smoking. Shawn Mccall smoking use included e-cigarettes. Shawn Mccall has never used smokeless tobacco. Shawn Mccall reports current alcohol use. Shawn Mccall reports that Shawn Mccall does not use drugs.    ROS Review of Systems  Constitutional:  Negative for fever.  Respiratory:  Negative for shortness of breath.   Cardiovascular:  Negative for chest pain.  Musculoskeletal:  Negative for arthralgias.  Skin:  Negative for rash.  Neurological:  Positive for numbness (with cramping. Some improvement).    Objective:  BP 130/75   Pulse 87   Temp 98 F (36.7 C)   Ht 6\' 1"  (1.854 m)   Wt 226 lb (102.5 kg)   SpO2 95%   BMI 29.82 kg/m   BP Readings from Last 3 Encounters:  09/23/23 130/75  08/21/23 (!) 159/79  07/16/23 132/74    Wt Readings from Last  3 Encounters:  09/23/23 226 lb (102.5 kg)  08/21/23 224 lb (101.6 kg)  07/16/23 223 lb 3.2 oz (101.2 kg)     Physical Exam Vitals reviewed.  Constitutional:      Appearance: Shawn Mccall is well-developed.  HENT:     Head: Normocephalic and atraumatic.     Right Ear: External ear normal.     Left Ear: External ear normal.     Mouth/Throat:     Pharynx: No oropharyngeal exudate or posterior oropharyngeal erythema.  Eyes:     Pupils: Pupils are equal, round, and reactive to light.  Cardiovascular:     Rate and Rhythm: Normal rate and regular rhythm.     Heart sounds: No murmur heard. Pulmonary:     Effort: No respiratory distress.     Breath sounds: Normal breath sounds.  Musculoskeletal:     Cervical back: Normal range of motion and neck supple.  Neurological:     Mental Status: Shawn Mccall is alert and oriented to person, place, and time.       Assessment & Plan:   Shawn Mccall was seen today for medical management of chronic issues.  Diagnoses and all orders for this visit:  Idiopathic peripheral neuropathy -     pregabalin (LYRICA) 300 MG capsule; Take  1 capsule (300 mg total) by mouth daily.  Imbalance       I have changed Shawn Mccall. Shawn Mccall pregabalin. I am also having him maintain Shawn Mccall Multiple Vitamins-Minerals (MULTIVITAMIN ADULT PO), aspirin EC, Melatonin, diphenhydrAMINE, Omega-3, BENFOTIAMINE PO, and ASHWAGANDHA PO.  Allergies as of 09/23/2023       Reactions   Contrast Media [iodinated Contrast Media]    Ketamine Anxiety        Medication List        Accurate as of September 23, 2023  5:26 PM. If you have any questions, ask your nurse or doctor.          ASHWAGANDHA PO   aspirin EC 81 MG tablet Take 1 tablet (81 mg total) by mouth daily.   BENFOTIAMINE PO   diphenhydrAMINE 25 mg capsule Commonly known as: BENADRYL Take 25 mg by mouth daily.   Melatonin 10 MG Tabs Take 10 mg by mouth at bedtime.   MULTIVITAMIN ADULT PO Take 1 tablet by mouth daily.    Omega-3 500 MG Caps Take 1 capsule by mouth daily.   pregabalin 300 MG capsule Commonly known as: Lyrica Take 1 capsule (300 mg total) by mouth daily. What changed:  medication strength how much to take Changed by: Broadus John Shikira Folino         Follow-up: Return in about 6 weeks (around 11/04/2023).  Mechele Claude, M.D.

## 2023-09-23 NOTE — Progress Notes (Signed)
  Virtual Visit via Telephone Note  I connected with Shawn Mccall , 09/23/23 9:47 AM by a telemedicine application and verified that I am speaking with the correct person using two identifiers.  Location: Patient: home Provider: home   I discussed the limitations of evaluation and management by telemedicine and the availability of in person appointments. The patient expressed understanding and agreed to proceed.   Shared Decision Making Visit Lung Cancer Screening Program [phone])   Eligibility: 73 y.o. Pack Years Smoking History Calculation = 42 pack years  (# packs/per year x # years smoked) Recent History of coughing up blood  no Unexplained weight loss? no ( >Than 15 pounds within the last 6 months) Prior History Lung / other cancer no (Diagnosis within the last 5 years already requiring surveillance chest CT Scans). Smoking Status Former Smoker Former Smokers: Years since quit: 4 years  Quit Date: 2021  Visit Components: Discussion included one or more decision making aids. YES Discussion included risk/benefits of screening. YES Discussion included potential follow up diagnostic testing for abnormal scans. YES Discussion included meaning and risk of over diagnosis. YES Discussion included meaning and risk of False Positives. YES Discussion included meaning of total radiation exposure. YES  Counseling Included: Importance of adherence to annual lung cancer LDCT screening. YES Impact of comorbidities on ability to participate in the program. YES Ability and willingness to under diagnostic treatment. YES  Smoking Cessation Counseling: Former Smokers:  Discussed the importance of maintaining cigarette abstinence. yes Diagnosis Code: Personal History of Nicotine Dependence. Q46.962 Information about tobacco cessation classes and interventions provided to patient. Yes Patient provided with "ticket" for LDCT Scan. yes Written Order for Lung Cancer Screening with LDCT  placed in Epic. Yes (CT Chest Lung Cancer Screening Low Dose W/O CM) [id number]  Z12.2-Screening of respiratory organs Z87.891-Personal history of nicotine dependence   Shawn Mccall 09/23/23

## 2023-09-23 NOTE — Patient Instructions (Signed)

## 2023-09-29 ENCOUNTER — Ambulatory Visit (HOSPITAL_COMMUNITY)
Admission: RE | Admit: 2023-09-29 | Discharge: 2023-09-29 | Disposition: A | Discharge status: Home / Self Care | Source: Ambulatory Visit | Attending: Acute Care | Admitting: Acute Care

## 2023-09-29 DIAGNOSIS — Z87891 Personal history of nicotine dependence: Secondary | ICD-10-CM | POA: Diagnosis not present

## 2023-09-29 DIAGNOSIS — Z122 Encounter for screening for malignant neoplasm of respiratory organs: Secondary | ICD-10-CM | POA: Insufficient documentation

## 2023-10-01 ENCOUNTER — Encounter: Payer: Self-pay | Admitting: Dermatology

## 2023-10-01 ENCOUNTER — Ambulatory Visit (INDEPENDENT_AMBULATORY_CARE_PROVIDER_SITE_OTHER): Payer: Medicare Other | Admitting: Dermatology

## 2023-10-01 VITALS — BP 154/83 | HR 89

## 2023-10-01 DIAGNOSIS — D1801 Hemangioma of skin and subcutaneous tissue: Secondary | ICD-10-CM | POA: Diagnosis not present

## 2023-10-01 DIAGNOSIS — L814 Other melanin hyperpigmentation: Secondary | ICD-10-CM

## 2023-10-01 DIAGNOSIS — L578 Other skin changes due to chronic exposure to nonionizing radiation: Secondary | ICD-10-CM

## 2023-10-01 DIAGNOSIS — D492 Neoplasm of unspecified behavior of bone, soft tissue, and skin: Secondary | ICD-10-CM | POA: Diagnosis not present

## 2023-10-01 DIAGNOSIS — D229 Melanocytic nevi, unspecified: Secondary | ICD-10-CM

## 2023-10-01 DIAGNOSIS — Z1283 Encounter for screening for malignant neoplasm of skin: Secondary | ICD-10-CM | POA: Diagnosis not present

## 2023-10-01 DIAGNOSIS — D485 Neoplasm of uncertain behavior of skin: Secondary | ICD-10-CM

## 2023-10-01 DIAGNOSIS — L821 Other seborrheic keratosis: Secondary | ICD-10-CM | POA: Diagnosis not present

## 2023-10-01 DIAGNOSIS — Z808 Family history of malignant neoplasm of other organs or systems: Secondary | ICD-10-CM | POA: Diagnosis not present

## 2023-10-01 DIAGNOSIS — W908XXA Exposure to other nonionizing radiation, initial encounter: Secondary | ICD-10-CM

## 2023-10-01 NOTE — Progress Notes (Signed)
    New Patient Visit   Subjective  Shawn Mccall is a 73 y.o. male who presents for the following: Skin Cancer Screening and Upper Body Skin Exam. No hx of skin cancer. Family hx of NMSC.  The patient presents for Upper Body Skin Exam (UBSE) for skin cancer screening and mole check. The patient has spots, moles and lesions to be evaluated, some may be new or changing.  Brother recently diagnosed with skin cancer.  The following portions of the chart were reviewed this encounter and updated as appropriate: medications, allergies, medical history  Review of Systems:  No other skin or systemic complaints except as noted in HPI or Assessment and Plan.  Objective  Well appearing patient in no apparent distress; mood and affect are within normal limits.  All skin waist up examined. Relevant physical exam findings are noted in the Assessment and Plan.  Left Upper Arm - Anterior, Mid Back, Right Breast Stuck on brown papules and plaques  Left Ear 4 mm brown black papule   Assessment & Plan   SEBORRHEIC KERATOSIS (3) Left Upper Arm - Anterior, Mid Back, Right Breast NEOPLASM OF UNCERTAIN BEHAVIOR OF SKIN Left Ear Skin / nail biopsy Type of biopsy: tangential   Informed consent: discussed and consent obtained   Timeout: patient name, date of birth, surgical site, and procedure verified   Procedure prep:  Patient was prepped and draped in usual sterile fashion Prep type:  Isopropyl alcohol Anesthesia: the lesion was anesthetized in a standard fashion   Anesthetic:  1% lidocaine w/ epinephrine 1-100,000 buffered w/ 8.4% NaHCO3 Instrument used: DermaBlade   Hemostasis achieved with: aluminum chloride   Outcome: patient tolerated procedure well   Post-procedure details: sterile dressing applied and wound care instructions given   Dressing type: bandage and petrolatum   Specimen 1 - Surgical pathology Differential Diagnosis: r/o angioma vs MM  Check Margins: No ACTINIC SKIN  DAMAGE   LENTIGINES   CHERRY ANGIOMA   MULTIPLE BENIGN NEVI   Skin cancer screening performed today.  Actinic Damage - Chronic condition, secondary to cumulative UV/sun exposure - diffuse scaly erythematous macules with underlying dyspigmentation - Recommend daily broad spectrum sunscreen SPF 30+ to sun-exposed areas, reapply every 2 hours as needed.  - Staying in the shade or wearing long sleeves, sun glasses (UVA+UVB protection) and wide brim hats (4-inch brim around the entire circumference of the hat) are also recommended for sun protection.  - Call for new or changing lesions.  Lentigines, Seborrheic Keratoses, Hemangiomas - Benign normal skin lesions - Benign-appearing - Call for any changes  Melanocytic Nevi - Tan-brown and/or pink-flesh-colored symmetric macules and papules - Benign appearing on exam today - Observation - Call clinic for new or changing moles - Recommend daily use of broad spectrum spf 30+ sunscreen to sun-exposed areas.    Return in about 1 year (around 09/30/2024) for TBSC.  I, Manual Meier, Surg Tech III, am acting as scribe for Gwenith Daily, MD.   Documentation: I have reviewed the above documentation for accuracy and completeness, and I agree with the above.  Gwenith Daily, MD

## 2023-10-01 NOTE — Patient Instructions (Addendum)
 PDL laser for nose Important Information  Due to recent changes in healthcare laws, you may see results of your pathology and/or laboratory studies on MyChart before the doctors have had a chance to review them. We understand that in some cases there may be results that are confusing or concerning to you. Please understand that not all results are received at the same time and often the doctors may need to interpret multiple results in order to provide you with the best plan of care or course of treatment. Therefore, we ask that you please give [place] 2 business days to thoroughly review all your results before contacting the office for clarification. Should we see a critical lab result, you will be contacted sooner.   If You Need Anything After Your Visit  If you have any questions or concerns for your doctor, please call our main line at [phone] If no one answers, please leave a voicemail as directed and we will return your call as soon as possible. Messages left after 4 pm will be answered the following business day.   You may also send [place] a message via MyChart. We typically respond to MyChart messages within 1-2 business days.  For prescription refills, please ask your pharmacy to contact our office. Our fax number is [phone].  If you have an urgent issue when the clinic is closed that cannot wait until the next business day, you can page your doctor at the number below.    Please note that while we do our best to be available for urgent issues outside of office hours, we are not available 24/7.   If you have an urgent issue and are unable to reach [place], you may choose to seek medical care at your doctor's office, retail clinic, urgent care center, or emergency room.  If you have a medical emergency, please immediately call 911 or go to the emergency department. In the event of inclement weather, please call our main line at [phone] for an update on the status of any delays or  closures.  Dermatology Medication Tips: Please keep the boxes that topical medications come in in order to help keep track of the instructions about where and how to use these. Pharmacies typically print the medication instructions only on the boxes and not directly on the medication tubes.   If your medication is too expensive, please contact our office at [phone] or send [place] a message through MyChart.   We are unable to tell what your co-pay for medications will be in advance as this is different depending on your insurance coverage. However, we may be able to find a substitute medication at lower cost or fill out paperwork to get insurance to cover a needed medication.   If a prior authorization is required to get your medication covered by your insurance company, please allow [place] 1-2 business days to complete this process.  Drug prices often vary depending on where the prescription is filled and some pharmacies may offer cheaper prices.  The website www.goodrx.com contains coupons for medications through different pharmacies. The prices here do not account for what the cost may be with help from insurance (it may be cheaper with your insurance), but the website can give you the price if you did not use any insurance.  - You can print the associated coupon and take it with your prescription to the pharmacy.  - You may also stop by our office during regular business hours and pick up a GoodRx coupon  card.  - If you need your prescription sent electronically to a different pharmacy, notify our office through [facility] or by phone at [phone]    Skin Education :   I counseled the patient regarding the following: Sun screen (SPF 30 or greater) should be applied during peak UV exposure (between 10am and 2pm) and reapplied after exercise or swimming.  The ABCDEs of melanoma were reviewed with the patient, and the importance of monthly self-examination of moles was emphasized.  Should any moles change in shape or color, or itch, bleed or burn, pt will contact our office for evaluation sooner then their interval appointment.  Plan: Sunscreen Recommendations I recommended a broad spectrum sunscreen with a SPF of 30 or higher. I explained that SPF 30 sunscreens block approximately 97 percent of the sun's harmful rays. Sunscreens should be applied at least 15 minutes prior to expected sun exposure and then every 2 hours after that as long as sun exposure continues. If swimming or exercising sunscreen should be reapplied every 45 minutes to an hour after getting wet or sweating. One ounce, or the equivalent of a shot glass full of sunscreen, is adequate to protect the skin not covered by a bathing suit. I also recommended a lip balm with a sunscreen as well. Sun protective clothing can be used in lieu of sunscreen but must be worn the entire time you are exposed to the sun's rays.  Patient Handout: Wound Care for Skin Biopsy Site  Taking Care of Your Skin Biopsy Site  Proper care of the biopsy site is essential for promoting healing and minimizing scarring. This handout provides instructions on how to care for your biopsy site to ensure optimal recovery.  1. Cleaning the Wound:  Clean the biopsy site daily with gentle soap and water. Gently pat the area dry with a clean, soft towel. Avoid harsh scrubbing or rubbing the area, as this can irritate the skin and delay healing.  2. Applying Aquaphor and Bandage:  After cleaning the wound, apply a thin layer of Aquaphor ointment to the biopsy site. Cover the area with a sterile bandage to protect it from dirt, bacteria, and friction. Change the bandage daily or as needed if it becomes soiled or wet.  3. Continued Care for One Week:  Repeat the cleaning, Aquaphor application, and bandaging process daily for one week following the biopsy procedure. Keeping the wound clean and moist during this initial healing period will help  prevent infection and promote optimal healing.  4. Massaging Aquaphor into the Area:  ---After one week, discontinue the use of bandages but continue to apply Aquaphor to the biopsy site. ----Gently massage the Aquaphor into the area using circular motions. ---Massaging the skin helps to promote circulation and prevent the formation of scar tissue.   Additional Tips:  Avoid exposing the biopsy site to direct sunlight during the healing process, as this can cause hyperpigmentation or worsen scarring. If you experience any signs of infection, such as increased redness, swelling, warmth, or drainage from the wound, contact your healthcare provider immediately. Follow any additional instructions provided by your healthcare provider for caring for the biopsy site and managing any discomfort. Conclusion:  Taking proper care of your skin biopsy site is crucial for ensuring optimal healing and minimizing scarring. By following these instructions for cleaning, applying Aquaphor, and massaging the area, you can promote a smooth and successful recovery. If you have any questions or concerns about caring for your biopsy site, don't hesitate  to contact your healthcare provider for guidance.

## 2023-10-01 NOTE — Progress Notes (Deleted)
 [name] Kitchen

## 2023-10-02 LAB — SURGICAL PATHOLOGY

## 2023-10-28 ENCOUNTER — Other Ambulatory Visit: Payer: Self-pay | Admitting: Acute Care

## 2023-10-28 DIAGNOSIS — Z87891 Personal history of nicotine dependence: Secondary | ICD-10-CM

## 2023-10-28 DIAGNOSIS — Z122 Encounter for screening for malignant neoplasm of respiratory organs: Secondary | ICD-10-CM

## 2023-11-04 ENCOUNTER — Ambulatory Visit (INDEPENDENT_AMBULATORY_CARE_PROVIDER_SITE_OTHER): Admitting: Family Medicine

## 2023-11-04 ENCOUNTER — Encounter: Payer: Self-pay | Admitting: Family Medicine

## 2023-11-04 VITALS — BP 129/59 | HR 78 | Temp 98.2°F | Ht 73.0 in | Wt 228.0 lb

## 2023-11-04 DIAGNOSIS — J432 Centrilobular emphysema: Secondary | ICD-10-CM

## 2023-11-04 DIAGNOSIS — G609 Hereditary and idiopathic neuropathy, unspecified: Secondary | ICD-10-CM | POA: Diagnosis not present

## 2023-11-04 DIAGNOSIS — I700 Atherosclerosis of aorta: Secondary | ICD-10-CM | POA: Diagnosis not present

## 2023-11-04 MED ORDER — ALBUTEROL SULFATE HFA 108 (90 BASE) MCG/ACT IN AERS: 2.0000 | INHALATION_SPRAY | Freq: Four times a day (QID) | RESPIRATORY_TRACT | 11 refills | Status: AC | PRN

## 2023-11-04 MED ORDER — ROSUVASTATIN CALCIUM 10 MG PO TABS
10.0000 mg | ORAL_TABLET | Freq: Every day | ORAL | 1 refills | Status: DC
Start: 2023-11-04 — End: 2024-05-21

## 2023-11-04 MED ORDER — PREGABALIN 300 MG PO CAPS: 300.0000 mg | ORAL_CAPSULE | Freq: Every day | ORAL | 0 refills | Status: DC

## 2023-11-04 NOTE — Progress Notes (Unsigned)
 Subjective:  Patient ID: Shawn Mccall, male    DOB: Apr 17, 1951  Age: 73 y.o. MRN: [id number]  CC: Medical Management of Chronic Issues (Discuss CT results)   HPI Shawn Mccall presents for follow-up on the peripheral neuropathy the Lyrica  has helped.  He has gone through the titration.  Relief is adequate.  He is gone from the titration of 50-200 now taking 300 mg and tolerating that quite well since March 17.  On March 23 he had a CT lung cancer screening.  He has questions about that to review today.  The test was not read until April 18.     11/04/2023   11:01 AM 07/16/2023    9:09 AM 06/17/2023    8:44 AM  Depression screen PHQ 2/9  Decreased Interest 0 0 0  Down, Depressed, Hopeless 0 0 0  PHQ - 2 Score 0 0 0  Altered sleeping 0 0   Tired, decreased energy 0 1   Change in appetite 0 0   Feeling bad or failure about yourself  0 0   Trouble concentrating 0 0   Moving slowly or fidgety/restless 0 0   Suicidal thoughts 0 0   PHQ-9 Score 0 1   Difficult doing work/chores Not difficult at all Not difficult at all     History Shawn Mccall has a past medical history of Allergy (1960's), AVM (arteriovenous malformation), Centrilobular emphysema (HCC) (11/05/2023), Kidney stones, and Stroke Shawn Mccall) (diagnosed 2016 at [facility]).   He has a past surgical history that includes Back surgery (07/09/1996); Inguinal hernia repair (07/09/1968); Spine surgery (removal detached disc segment mid 90's); and Laminectomy.   His family history includes Cancer in his father and mother.He reports that he has quit smoking. His smoking use included e-cigarettes. He has never used smokeless tobacco. He reports current alcohol use. He reports that he does not use drugs.    ROS Review of Systems  Constitutional:  Negative for fever.  Respiratory:  Negative for shortness of breath.   Cardiovascular:  Negative for chest pain.  Musculoskeletal:  Negative for arthralgias.  Skin:  Negative for rash.     Objective:  BP (!) 129/59   Pulse 78   Temp 98.2 F (36.8 C)   Ht 6\' 1"  (1.854 m)   Wt 228 lb (103.4 kg)   SpO2 97%   BMI 30.08 kg/m   BP Readings from Last 3 Encounters:  11/04/23 (!) 129/59  10/01/23 (!) 154/83  09/23/23 130/75    Wt Readings from Last 3 Encounters:  11/04/23 228 lb (103.4 kg)  09/23/23 226 lb (102.5 kg)  08/21/23 224 lb (101.6 kg)     Physical Exam Vitals reviewed.  Constitutional:      Appearance: He is well-developed.  HENT:     Head: Normocephalic and atraumatic.     Right Ear: External ear normal.     Left Ear: External ear normal.     Mouth/Throat:     Pharynx: No oropharyngeal exudate or posterior oropharyngeal erythema.  Eyes:     Pupils: Pupils are equal, round, and reactive to light.  Cardiovascular:     Rate and Rhythm: Normal rate and regular rhythm.     Heart sounds: No murmur heard. Pulmonary:     Effort: No respiratory distress.     Breath sounds: Normal breath sounds.  Musculoskeletal:     Cervical back: Normal range of motion and neck supple.  Neurological:     Mental Status: He is alert  and oriented to person, place, and time.      Assessment & Plan:  Aortic atherosclerosis (HCC)  Idiopathic peripheral neuropathy -     Pregabalin ; Take 1 capsule (300 mg total) by mouth daily.  Dispense: 90 capsule; Refill: 0  Centrilobular emphysema (HCC)  Other orders -     Albuterol Sulfate HFA; Inhale 2 puffs into the lungs every 6 (six) hours as needed for wheezing or shortness of breath.  Dispense: 1 each; Refill: 11 -     Rosuvastatin Calcium; Take 1 tablet (10 mg total) by mouth daily. For cholesterol  Dispense: 90 tablet; Refill: 1   CT of the lungs was read as negative for signs of lung cancer.  Aortic atherosclerosis was noted.  He asked about what treatment would be appropriate for that and I recommended that he start on rosuvastatin.  That has been prescribed before but he has not followed through but he insists that  he will this time.  Emphysema was noted on the CT scan.  He does occasionally have some dyspnea on exertion but it is not frequent enough to merit taking a daily inhaler.  Therefore albuterol was prescribed as above.  Aortic atherosclerosis  Follow-up: Return in about 6 months (around 05/05/2024).  Roise Cleaver, M.D.

## 2023-11-05 ENCOUNTER — Encounter: Payer: Self-pay | Admitting: Family Medicine

## 2023-11-05 DIAGNOSIS — I700 Atherosclerosis of aorta: Secondary | ICD-10-CM | POA: Insufficient documentation

## 2023-11-05 DIAGNOSIS — J432 Centrilobular emphysema: Secondary | ICD-10-CM | POA: Insufficient documentation

## 2023-11-05 HISTORY — DX: Centrilobular emphysema: J43.2

## 2023-11-07 ENCOUNTER — Encounter: Payer: Self-pay | Admitting: *Deleted

## 2024-02-20 ENCOUNTER — Encounter: Payer: Self-pay | Admitting: Family Medicine

## 2024-02-21 ENCOUNTER — Other Ambulatory Visit: Payer: Self-pay | Admitting: Medical Genetics

## 2024-03-02 ENCOUNTER — Other Ambulatory Visit: Payer: Self-pay | Admitting: Medical Genetics

## 2024-03-02 DIAGNOSIS — Z006 Encounter for examination for normal comparison and control in clinical research program: Secondary | ICD-10-CM

## 2024-03-20 LAB — GENECONNECT MOLECULAR SCREEN: Genetic Analysis Overall Interpretation: NEGATIVE

## 2024-03-24 ENCOUNTER — Other Ambulatory Visit: Payer: Self-pay | Admitting: Family Medicine

## 2024-03-24 DIAGNOSIS — G609 Hereditary and idiopathic neuropathy, unspecified: Secondary | ICD-10-CM

## 2024-03-26 ENCOUNTER — Other Ambulatory Visit: Payer: Self-pay | Admitting: Family Medicine

## 2024-03-26 DIAGNOSIS — G609 Hereditary and idiopathic neuropathy, unspecified: Secondary | ICD-10-CM

## 2024-05-04 ENCOUNTER — Ambulatory Visit: Admitting: Family Medicine

## 2024-05-11 ENCOUNTER — Ambulatory Visit: Payer: Self-pay | Admitting: Family Medicine

## 2024-05-21 ENCOUNTER — Encounter: Payer: Self-pay | Admitting: Family Medicine

## 2024-05-21 ENCOUNTER — Ambulatory Visit (INDEPENDENT_AMBULATORY_CARE_PROVIDER_SITE_OTHER): Admitting: Family Medicine

## 2024-05-21 VITALS — BP 126/73 | HR 74 | Temp 97.9°F | Ht 73.0 in | Wt 230.0 lb

## 2024-05-21 DIAGNOSIS — I700 Atherosclerosis of aorta: Secondary | ICD-10-CM

## 2024-05-21 DIAGNOSIS — G8929 Other chronic pain: Secondary | ICD-10-CM

## 2024-05-21 DIAGNOSIS — J432 Centrilobular emphysema: Secondary | ICD-10-CM

## 2024-05-21 DIAGNOSIS — G609 Hereditary and idiopathic neuropathy, unspecified: Secondary | ICD-10-CM

## 2024-05-21 DIAGNOSIS — M545 Low back pain, unspecified: Secondary | ICD-10-CM

## 2024-05-21 DIAGNOSIS — R2689 Other abnormalities of gait and mobility: Secondary | ICD-10-CM

## 2024-05-21 MED ORDER — PREGABALIN 200 MG PO CAPS: 400.0000 mg | ORAL_CAPSULE | Freq: Every day | ORAL | 1 refills | Status: AC

## 2024-05-21 NOTE — Progress Notes (Signed)
 Subjective:  Patient ID: Shawn Mccall, male    DOB: 1950-11-25  Age: 73 y.o. MRN: [id number]  CC: Medical Management of Chronic Issues (No concerns)   HPI  Discussed the use of AI scribe software for clinical note transcription with the patient, who gave verbal consent to proceed.  History of Present Illness Shawn Mccall is a 73 year old male with idiopathic peripheral neuropathy and low back pain who presents for a follow-up and medication refill.  His idiopathic peripheral neuropathy and low back pain are managed with pregabalin , though he experiences occasional exacerbations. He describes a sensation of pressure in his feet when walking, which limits his mobility, and more discomfort at night when his feet are elevated.  He has a persistent issue with an ingrown hair that has developed into a hard, pink bump, which he notices every morning. He describes it as more tissue-related rather than fluid-filled.  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 He experiences dizziness and has adapted to a slight imbalance over time. He currently manages without mobility aids.  He stopped taking rosuvastatin , initially prescribed for stroke prevention, due to concerns about potential side effects like muscle and joint pain. He has not experienced any adverse effects from the medication in the past.  He lives alone and has a niece living on the same property who attends church with him.          11/04/2023   11:01 AM 07/16/2023    9:09 AM 06/17/2023    8:44 AM  Depression screen PHQ 2/9  Decreased Interest 0 0 0  Down, Depressed, Hopeless 0 0 0  PHQ - 2 Score 0 0 0  Altered sleeping 0 0   Tired, decreased energy 0 1   Change in appetite 0 0   Feeling bad or failure about yourself  0 0   Trouble concentrating 0 0   Moving slowly or fidgety/restless 0 0   Suicidal thoughts 0 0   PHQ-9 Score 0  1    Difficult doing  work/chores Not difficult at all Not difficult at all      Data saved with a previous flowsheet row definition    History Shawn Mccall has a past medical history of Allergy (1960's), AVM (arteriovenous malformation), Centrilobular emphysema (HCC) (11/05/2023), Kidney stones, and Stroke [facility]) (diagnosed 2016 at [facility]).   He has a past surgical history that includes Back surgery (07/09/1996); Inguinal hernia repair (07/09/1968); Spine surgery (removal detached disc segment mid 90's); and Laminectomy.   His family history includes Cancer in his father and mother.He reports that he has quit smoking. His smoking use included e-cigarettes. He has never used smokeless tobacco. He reports current alcohol use. He reports that he does not use drugs.    ROS Review of Systems  Constitutional: Negative.   HENT: Negative.    Eyes:  Negative for visual disturbance.  Respiratory:  Negative for cough and shortness of breath.   Cardiovascular:  Negative for chest pain and leg swelling.  Gastrointestinal:  Negative for abdominal pain, diarrhea, nausea and vomiting.  Genitourinary:  Negative for difficulty urinating.  Musculoskeletal:  Negative for arthralgias and myalgias.  Skin:  Negative for rash.  Neurological:  Negative for headaches.  Psychiatric/Behavioral:  Negative for sleep disturbance.     Objective:  BP 126/73   Pulse 74   Temp 97.9 F (36.6 C)   Ht 6' 1 (1.854 m)  Wt 230 lb (104.3 kg)   SpO2 96%   BMI 30.34 kg/m   BP Readings from Last 3 Encounters:  05/21/24 126/73  11/04/23 (!) 129/59  10/01/23 (!) 154/83    Wt Readings from Last 3 Encounters:  05/21/24 230 lb (104.3 kg)  11/04/23 228 lb (103.4 kg)  09/23/23 226 lb (102.5 kg)     Physical Exam Physical Exam GENERAL: Alert, cooperative, well developed, no acute distress HEENT: Normocephalic, normal oropharynx, moist mucous membranes CHEST: Clear to auscultation bilaterally, No wheezes, rhonchi, or  crackles CARDIOVASCULAR: Normal heart rate and rhythm, S1 and S2 normal without murmurs ABDOMEN: Soft, non-tender, non-distended, without organomegaly, Normal bowel sounds EXTREMITIES: No cyanosis or edema NEUROLOGICAL: Cranial nerves grossly intact, Moves all extremities without gross motor or sensory deficit   Assessment & Plan:  Aortic atherosclerosis  Idiopathic peripheral neuropathy -     Pregabalin ; Take 2 capsules (400 mg total) by mouth daily.  Dispense: 180 capsule; Refill: 1  Centrilobular emphysema (HCC)  Chronic midline low back pain without sciatica  Imbalance    Assessment and Plan Assessment & Plan Idiopathic peripheral neuropathy and low back pain   Chronic idiopathic peripheral neuropathy and low back pain were previously well-controlled with pregabalin . He reports occasional increased pressure in his feet, especially when walking and at night, suggesting the current dosage may not provide optimal relief. Potential side effects of increasing pregabalin , such as drowsiness and dizziness, were discussed. He has adapted to these due to a previous cerebellar stroke. Increased pregabalin  to 400 mg daily to assess for improved relief of neuropathy and back pain.  Cerebellar stroke and cavernous malformation of midbrain with abnormal gait and mobility   Cerebellar stroke and cavernous malformation of the midbrain have resulted in abnormal gait and mobility issues, which he has managed well. Balance issues are well-adapted. The importance of rosuvastatin  for stroke prevention was discussed, given the cerebellar stroke. He had stopped rosuvastatin  due to concerns about side effects but was advised of its benefits in reducing stroke risk by 67%. Continue rosuvastatin  10 mg daily for stroke prevention and monitor for any muscle aches or joint pain as potential side effects.       Follow-up: Return in about 3 months (around 08/21/2024) for [facility] physical.  Butler Der,  M.D.

## 2024-05-24 ENCOUNTER — Encounter: Payer: Self-pay | Admitting: Family Medicine

## 2024-07-17 ENCOUNTER — Encounter: Payer: Self-pay | Admitting: Family Medicine

## 2024-07-21 ENCOUNTER — Encounter: Payer: Self-pay | Admitting: Gastroenterology

## 2024-08-10 ENCOUNTER — Telehealth: Payer: Self-pay

## 2024-08-10 ENCOUNTER — Ambulatory Visit

## 2024-08-10 NOTE — Progress Notes (Deleted)
 No egg or soy allergy known to patient  No issues known to pt with past sedation with any surgeries or procedures Patient denies ever being told they had issues or difficulty with intubation  No FH of Malignant Hyperthermia Pt is not on diet pills Pt is not on  home 02  Pt is not on blood thinners  Pt denies issues with constipation  No A fib or A flutter Have any cardiac testing pending--*** Pt can ambulate  Pt denies use of chewing tobacco Discussed diabetic I weight loss medication holds Discussed NSAID holds Checked BMI Pt instructed to use [employer] or GoodRx for a price reduction on prep  Patient's chart reviewed by Cathlyn Parsons CNRA prior to previsit and patient appropriate for the [facility].  Pre visit completed and red dot placed by patient's name on their procedure day (on provider's schedule).

## 2024-08-10 NOTE — Telephone Encounter (Signed)
 NO SHOW.   Patient did not call back and reschedule the pre visit.  Pre visit and colonoscopy were cancelled.  No Show letter sent in My chart and in the mail.

## 2024-08-11 ENCOUNTER — Ambulatory Visit

## 2024-08-24 ENCOUNTER — Encounter: Admitting: Gastroenterology

## 2024-08-25 ENCOUNTER — Encounter: Admitting: Family Medicine

## 2024-09-28 ENCOUNTER — Ambulatory Visit (HOSPITAL_COMMUNITY)

## 2024-09-29 ENCOUNTER — Ambulatory Visit: Admitting: Dermatology
